# Patient Record
Sex: Female | Born: 1965
Health system: Southern US, Community
[De-identification: ages and names within clinical notes are randomized; demographics above are authoritative.]

## PROBLEM LIST (undated history)

## (undated) DIAGNOSIS — N3281 Overactive bladder: Secondary | ICD-10-CM

## (undated) DIAGNOSIS — S86019A Strain of unspecified Achilles tendon, initial encounter: Secondary | ICD-10-CM

## (undated) DIAGNOSIS — J189 Pneumonia, unspecified organism: Secondary | ICD-10-CM

## (undated) HISTORY — DX: Pneumonia, unspecified organism: J18.9

## (undated) HISTORY — DX: Overactive bladder: N32.81

## (undated) HISTORY — DX: Strain of unspecified achilles tendon, initial encounter: S86.019A

---

## 1998-03-23 ENCOUNTER — Other Ambulatory Visit: Admission: RE | Admit: 1998-03-23 | Discharge: 1998-03-23 | Payer: Self-pay | Admitting: Gynecology

## 1999-06-18 ENCOUNTER — Other Ambulatory Visit: Admission: RE | Admit: 1999-06-18 | Discharge: 1999-06-18 | Payer: Self-pay | Admitting: Gynecology

## 1999-07-31 ENCOUNTER — Emergency Department (HOSPITAL_COMMUNITY): Admission: EM | Admit: 1999-07-31 | Discharge: 1999-07-31 | Payer: Self-pay | Admitting: Internal Medicine

## 2000-07-27 ENCOUNTER — Other Ambulatory Visit: Admission: RE | Admit: 2000-07-27 | Discharge: 2000-07-27 | Payer: Self-pay | Admitting: Gynecology

## 2001-09-19 ENCOUNTER — Other Ambulatory Visit: Admission: RE | Admit: 2001-09-19 | Discharge: 2001-09-19 | Payer: Self-pay | Admitting: Gynecology

## 2002-07-23 ENCOUNTER — Encounter: Admission: RE | Admit: 2002-07-23 | Discharge: 2002-07-23 | Payer: Self-pay | Admitting: Family Medicine

## 2002-08-16 ENCOUNTER — Encounter: Admission: RE | Admit: 2002-08-16 | Discharge: 2002-08-16 | Payer: Self-pay | Admitting: Sports Medicine

## 2002-09-25 ENCOUNTER — Other Ambulatory Visit: Admission: RE | Admit: 2002-09-25 | Discharge: 2002-09-25 | Payer: Self-pay | Admitting: Gynecology

## 2003-10-20 ENCOUNTER — Other Ambulatory Visit: Admission: RE | Admit: 2003-10-20 | Discharge: 2003-10-20 | Payer: Self-pay | Admitting: Gynecology

## 2003-10-22 ENCOUNTER — Encounter: Admission: RE | Admit: 2003-10-22 | Discharge: 2003-10-22 | Payer: Self-pay | Admitting: General Surgery

## 2004-12-21 ENCOUNTER — Other Ambulatory Visit: Admission: RE | Admit: 2004-12-21 | Discharge: 2004-12-21 | Payer: Self-pay | Admitting: Gynecology

## 2004-12-23 ENCOUNTER — Encounter: Admission: RE | Admit: 2004-12-23 | Discharge: 2004-12-23 | Payer: Self-pay | Admitting: Gynecology

## 2006-02-08 ENCOUNTER — Other Ambulatory Visit: Admission: RE | Admit: 2006-02-08 | Discharge: 2006-02-08 | Payer: Self-pay | Admitting: Gynecology

## 2006-02-16 ENCOUNTER — Encounter: Admission: RE | Admit: 2006-02-16 | Discharge: 2006-02-16 | Payer: Self-pay | Admitting: Gynecology

## 2006-11-16 ENCOUNTER — Ambulatory Visit: Payer: Self-pay | Admitting: Internal Medicine

## 2007-04-30 ENCOUNTER — Ambulatory Visit: Payer: Self-pay | Admitting: Internal Medicine

## 2007-05-17 ENCOUNTER — Other Ambulatory Visit: Admission: RE | Admit: 2007-05-17 | Discharge: 2007-05-17 | Payer: Self-pay | Admitting: Obstetrics & Gynecology

## 2007-07-09 ENCOUNTER — Encounter: Admission: RE | Admit: 2007-07-09 | Discharge: 2007-07-09 | Payer: Self-pay | Admitting: Obstetrics & Gynecology

## 2008-07-08 ENCOUNTER — Other Ambulatory Visit: Admission: RE | Admit: 2008-07-08 | Discharge: 2008-07-08 | Payer: Self-pay | Admitting: Obstetrics and Gynecology

## 2008-07-31 ENCOUNTER — Encounter: Admission: RE | Admit: 2008-07-31 | Discharge: 2008-07-31 | Payer: Self-pay | Admitting: Obstetrics & Gynecology

## 2009-08-03 ENCOUNTER — Encounter: Admission: RE | Admit: 2009-08-03 | Discharge: 2009-08-03 | Payer: Self-pay | Admitting: Obstetrics & Gynecology

## 2010-06-03 ENCOUNTER — Ambulatory Visit: Payer: Self-pay | Admitting: Sports Medicine

## 2010-06-03 DIAGNOSIS — S93499A Sprain of other ligament of unspecified ankle, initial encounter: Secondary | ICD-10-CM | POA: Insufficient documentation

## 2010-06-03 DIAGNOSIS — M7662 Achilles tendinitis, left leg: Secondary | ICD-10-CM | POA: Insufficient documentation

## 2010-06-03 DIAGNOSIS — S96819A Strain of other specified muscles and tendons at ankle and foot level, unspecified foot, initial encounter: Secondary | ICD-10-CM

## 2010-06-17 ENCOUNTER — Ambulatory Visit: Payer: Self-pay | Admitting: Sports Medicine

## 2010-07-15 ENCOUNTER — Ambulatory Visit: Payer: Self-pay | Admitting: Sports Medicine

## 2010-08-23 ENCOUNTER — Ambulatory Visit: Payer: Self-pay | Admitting: Sports Medicine

## 2010-09-30 ENCOUNTER — Encounter: Admission: RE | Admit: 2010-09-30 | Discharge: 2010-09-30 | Payer: Self-pay | Admitting: Obstetrics & Gynecology

## 2010-10-12 ENCOUNTER — Ambulatory Visit: Payer: Self-pay | Admitting: Sports Medicine

## 2010-12-14 NOTE — Assessment & Plan Note (Signed)
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 Since her last visit, she has been using the nitroglycerine patches on her left heel with no notable side effects. She did try to do the heel raises once, but felt some pain and soreness in her left calf, so she did not continue. Overall, she feels improved, with no pain in her achilles today. She has some mild tightness in the tendon this morning, that she suspects is due to the cycling and elliptical she has done over the past three days. She is not taking any anit-inflammatories, and has not tried to do any running.  She denies any new pain in her foot or ankle.  Allergies: No Known Drug Allergies  Physical Exam  General:  Well-developed,well-nourished,in no acute distress; alert,appropriate and cooperative throughout examination Msk:  ANKLE: right achilles without edema or erythma, no tenderness to palpation along or beside the tendon. No tenderness to palpation at the medial and lateral malleoli, or the plantar fascia insertion on the calcaneus.  Dorsiflexion and plantarflexion of the right ankle is without pain, standing and walking are without pain as well.  Running gait after insertion of heel lifts was normal, without pain. Additional Exam:  Ultrasound  AT shows much improved hypoechoic areas that represent improvement in the partial tendon tear. Less doppler blood flow to the tendon than previously.  Images saved   Impression & Recommendations:  Problem # 1:  ACHILLES TENDON TEAR (ICD-845.09)  Patient reports improvement in clinical symptoms and ultrasound shows great improvement. The  nitro patches and rest seem to have worked well. She will slowly begin to do the calf exercises, increasing her reps and sets as she feels able. She will use her new sports insoles with heel lifts when she runs and walks. She may start slowly, as per patient instructions, being careful not to illicit pain. She should also continue to ice her heels after exercise. She should continue to use the nitro patches and return in four weeks to ensure continued healing.  Orders: Sports Insoles [phone])  Complete Medication List: 1)  Loestrin Fe 1/20 Tabs (Norethin ace-eth estrad-fe tabs) 2)  Zithromax Z-pak Tabs (Azithromycin tabs) .... As directed 3)  Guaifenesin Ac 100-10 Mg/56ml Liqd (Guaifenesin-codeine) .... One or 2 teaspoons p.o. nightly p.r.n. cough 4)  Nitroglycerin 0.2 Mg/hr Pt24 (Nitroglycerin) .... Apply 1/4 patch to affected area on achilles daily.  Patient Instructions: 1)  Ease into calf exercises 2)  heels are good 3)  use sports insoles w the heel lifts 4)  OK to ease into some running 5)  try 5 mins / walk 2 for total of 21 mins for 1 week 6)  then 5/2 for 28 mins week 2 7)  Then 15/2  x 2 for a total of 34 mins week 3 8)  week 4 7mins/ 2 walk x 2 - total of 44 mins and then reck 9)  keep up NTG 10)  keep up icing at end of Tx 11)  reck 4 wks Prescriptions: NITROGLYCERIN 0.2 MG/HR  PT24 (NITROGLYCERIN) Apply 1/4 patch to affected area on achilles daily.  #30 x 0   Entered and Authorized by:   Enid Baas MD   Signed by:   Enid Baas MD on 06/17/2010   Method used:   Electronically to        [facility] (retail)       [address]       [place], [place]  [address]       Ph: [phone]       Fax: [phone]   RxID:   [id number]

## 2010-12-14 NOTE — Assessment & Plan Note (Signed)
Summary: FOOT ISSUES   Vital Signs:  Patient profile:   45 year old female Weight:      128 pounds Pulse rate:   60 / minute BP sitting:   150 / 84  (left arm)  Vitals Entered By: Terese Door (June 03, 2010 1:58 PM) CC: left heel issue Pain Assessment Patient in pain? yes     Location: heel Intensity: 3   CC:  left heel issue.  History of Present Illness: 45 yo F with pain at left heel since Sunday.  Pt is a regular long distance runner, does marathons.  Ran 8 miles, did fine, then Monday had some swelling in her heel.  Didn't think much of it and ran another 5 miles without pain.  Later that evening she developed worsening swelling to the size of a golf ball and pain, antalgic gait.  She averages 6 miles a day and was going to start training for another marathon in the fall.  Current Medications (verified): 1)  Loestrin Fe 1/20   Tabs (Norethin Ace-Eth Estrad-Fe Tabs) 2)  Zithromax Z-Pak   Tabs (Azithromycin Tabs) .... As Directed 3)  Guaifenesin Ac 100-10 Mg/8ml  Liqd (Guaifenesin-Codeine) .... One or 2 Teaspoons P.o. Nightly P.r.n. Cough 4)  Nitroglycerin 0.2 Mg/hr Pt24 (Nitroglycerin) .... Apply 1/4 Patch To Affected Area On Achilles Daily.  Allergies (verified): No Known Drug Allergies  Review of Systems       See HPI  Physical Exam  General:  Well-developed,well-nourished,in no acute distress; alert,appropriate and cooperative throughout examination Msk:  Ankle: L significant swelling at insertion of achilles into calcaneus. Range of motion is full in all directions. Strength is 5/5 in all directions. Stable lateral and medial ligaments; squeeze test and kleiger test unremarkable; Talar dome nontender; No pain at base of 5th MT; No tenderness over cuboid; No tenderness over N spot or navicular prominence No tenderness on posterior aspects of lateral and medial malleolus No sign of peroneal tendon subluxations; Negative tarsal tunnel tinel's Able to walk 4  steps. Additional Exam:  MSK [place]: Partial tear noted on deep surface of achilles tendon.  Hypoechoic change in this area that extends into retrocalcaneal bursa. seen.  Minimal hypervascularity seen with doppler.  Images saved.   Impression & Recommendations:  Problem # 1:  ACHILLES TENDON TEAR (ICD-845.09) Assessment New  Partial tear.   NTG 0.2mg /hr 1/4 patch to achilles insertion daily. Heel lifts, start 2 inches, down on 1 up on 2, then in 1-2wks when painless can progress to down on 1 up on 1. May run in 2 weeks if painless but would need to see her to re-scan before she does this. Otherwise RTC 4 weeks for re-scan.  Orders: [place] LIMITED ([id number])  Complete Medication List: 1)  Loestrin Fe 1/20 Tabs (Norethin ace-eth estrad-fe tabs) 2)  Zithromax Z-pak Tabs (Azithromycin tabs) .... As directed 3)  Guaifenesin Ac 100-10 Mg/101ml Liqd (Guaifenesin-codeine) .... One or 2 teaspoons p.o. nightly p.r.n. cough 4)  Nitroglycerin 0.2 Mg/hr Pt24 (Nitroglycerin) .... Apply 1/4 patch to affected area on achilles daily.  Patient Instructions: 1)  Great to meet you, 2)  You have a small tear in your achilles tendon. 3)  Nitroglycerin 0.2 1/4 patch on the area of maximal tenderness daily. 4)  Do heel drops from a height of  ~2 inches down on 1, up on both for a week or 2, if getting strong and painless with that then go down on 1 and up on one, slowly. 5)  You can run in 2 weeks if painless. 6)  Come back to see [place] in 4 weeks to re-scan. 7)  -Dr. Benjamin Stain Prescriptions: NITROGLYCERIN 0.2 MG/HR PT24 (NITROGLYCERIN) Apply 1/4 patch to affected area on achilles daily.  #4 weeks QS x 0   Entered by:   Rodney Langton MD   Authorized by:   Enid Baas MD   Signed by:   Rodney Langton MD on 06/03/2010   Method used:   Electronically to        [facility] (retail)       [address]       [place], [place]  [address]       Ph: [phone]        Fax: [phone]   RxID:   [id number]

## 2010-12-14 NOTE — Assessment & Plan Note (Signed)
Summary: F/U,MC   Vital Signs:  Patient profile:   45 year old female BP sitting:   122 / 81  Vitals Entered By: Lillia Pauls CMA (August 23, 2010 9:56 AM)  History of Present Illness: Sun feels about 80% better  She is able to run 30 to 35 MPW avoiding hills cont on NTG w no side effects  she is able to do 2 sets of heel raises on step w one leg but not 3 only 10 reps to set  Allergies: No Known Drug Allergies  Physical Exam  General:  Well-developed,well-nourished,in no acute distress; alert,appropriate and cooperative throughout examination Msk:  palpation of AT bilat reveals no TTP no swelling no nodules heel nontender no limp on walk    Impression & Recommendations:  Problem # 1:  ACHILLES TENDON TEAR (ICD-845.09)  This  is much improved on sxs and on scanning  still weak on Heel raises on step will progress these until she can do 3 sets of 15 on 1 leg  NTG keep as is for 1 wk every other day for 1 wk q 3d for 1 wk  MSK [place] is now normalized to width of 0.41 cms norm doppler flow no nodules slt hypoechoic fluid at insertion of AT only seen long and trans view images saved  follow above and then try to wean off and see me in 5 to 6 weeks with running shoes and for gait eval  Orders: [place] LIMITED ([id number])  Complete Medication List: 1)  Loestrin Fe 1/20 Tabs (Norethin ace-eth estrad-fe tabs) 2)  Zithromax Z-pak Tabs (Azithromycin tabs) .... As directed 3)  Guaifenesin Ac 100-10 Mg/79ml Liqd (Guaifenesin-codeine) .... One or 2 teaspoons p.o. nightly p.r.n. cough 4)  Nitroglycerin 0.2 Mg/hr Pt24 (Nitroglycerin) .... Apply 1/4 patch to affected area on achilles daily.

## 2010-12-14 NOTE — Assessment & Plan Note (Signed)
Summary: f/u,mc   Vital Signs:  Patient profile:   45 year old female BP sitting:   124 / 70  Vitals Entered By: Lillia Pauls CMA (October 12, 2010 11:38 AM)  History of Present Illness: Pt returns to clinic for f/u of left achilles tendon which she reports is improved, no longer having pain.  Running normally- longest run 13 miles- no problems. Taking NTG every 3 days. no swelling after runs comft in green sports insoles w heel lift   Allergies: No Known Drug Allergies  Physical Exam  General:  Well-developed,well-nourished,in no acute distress; alert,appropriate and cooperative throughout examination Msk:  No fluid in lt achilles tendon. no nodules noted no TTP good strength  running gait looks neutral no limp midfoot strike with good form     Impression & Recommendations:  Problem # 1:  ACHILLES TENDON TEAR (ICD-845.09) This clinically appears healed and was norm by [place] on last visit  will let her stop NTG  cont exercises  modify training  as needed rtc  Complete Medication List: 1)  Loestrin Fe 1/20 Tabs (Norethin ace-eth estrad-fe tabs) 2)  Zithromax Z-pak Tabs (Azithromycin tabs) .... As directed 3)  Guaifenesin Ac 100-10 Mg/29ml Liqd (Guaifenesin-codeine) .... One or 2 teaspoons p.o. nightly p.r.n. cough 4)  Nitroglycerin 0.2 Mg/hr Pt24 (Nitroglycerin) .... Apply 1/4 patch to affected area on achilles daily.  Patient Instructions: 1)  Achilles tendon has healed, ok to stop NTG.  2)  Continue doing achilles exercises 3 times per week. 3)  Ok to incorporate gradually speed work and hills.  4)  For training during a week- can do intervals or hills 5)  the next week do a tempo run- run 20 minutes at 5-10K pace 6)  Every other week do long run    Orders Added: 1)  Est. Patient Level III [78295]

## 2010-12-14 NOTE — Assessment & Plan Note (Signed)
Summary: F/U U/S,MC   Vital Signs:  Patient profile:   45 year old female BP sitting:   137 / 89  Vitals Entered By: Enid Baas MD (July 15, 2010 1:56 PM)  History of Present Illness: Left AT is now pain free now 6 weeks of NTG no swelling no side effects w meds  now is up to 20 mins run / 2 walk/ x 2   daily x 2 then 1 rest day  Allergies: No Known Drug Allergies  Physical Exam  General:  Well-developed,well-nourished,in no acute distress; alert,appropriate and cooperative throughout examination Msk:  left non tender to squeeze of AT slt swelling over calcaneus  no swelling over AT able to walk and do  heel raise w no pain Additional Exam:  MSK [place] area of tear seems intact now some edema persists AT is 0.47 LT vs 0.42 RT swelling in left retrocalcaneal bursa persists no abnl doppler  images saved   Impression & Recommendations:  Problem # 1:  ACHILLES TENDON TEAR (ICD-845.09)  much improved  move exercises to 1 leg  run up to 30 mins cont and inc 5 min per wk  cont ntg  reck 6 wks  Orders: [place] LIMITED ([id number])  Complete Medication List: 1)  Loestrin Fe 1/20 Tabs (Norethin ace-eth estrad-fe tabs) 2)  Zithromax Z-pak Tabs (Azithromycin tabs) .... As directed 3)  Guaifenesin Ac 100-10 Mg/74ml Liqd (Guaifenesin-codeine) .... One or 2 teaspoons p.o. nightly p.r.n. cough 4)  Nitroglycerin 0.2 Mg/hr Pt24 (Nitroglycerin) .... Apply 1/4 patch to affected area on achilles daily.

## 2011-01-25 ENCOUNTER — Ambulatory Visit (INDEPENDENT_AMBULATORY_CARE_PROVIDER_SITE_OTHER): Payer: 59 | Admitting: Sports Medicine

## 2011-01-25 ENCOUNTER — Encounter: Payer: Self-pay | Admitting: Sports Medicine

## 2011-01-25 DIAGNOSIS — M7660 Achilles tendinitis, unspecified leg: Secondary | ICD-10-CM | POA: Insufficient documentation

## 2011-02-01 NOTE — Assessment & Plan Note (Signed)
Summary: ACHILLIES INJURY,MC TRAINING FOR MARATHON   Vital Signs:  Patient profile:   45 year old female BP sitting:   142 / 80  Vitals Entered By: Lillia Pauls CMA (January 25, 2011 9:02 AM)  History of Present Illness: Past sat did 21 mi run next day heel is painful seen last summer w AT and swollen bursa still keeping up some calf exercises  MPW 40 to 50 no real probs no change in shoes  Allergies: No Known Drug Allergies  Physical Exam  General:  Well-developed,well-nourished,in no acute distress; alert,appropriate and cooperative throughout examination Msk:  no real swelling at left AT pinch test is neg slt tenderness just behind AT calcaneus squeeze nontender arch normal to slt high  able to do heel raise  MSK [place] there is discreet swelling in the retrocalcaneal bursa the AT is normal at 0.37 no real increase in doppler flow no tears   Impression & Recommendations:  Problem # 1:  ACHILLES BURSITIS OR TENDINITIS (ICD-726.71)  while this is same tendon as she had w a partial tear the only changes today are in the retrocalcaneal bursa  will give short course of prednisone use heel lift start AT exercise protocol  reck 2 wks keep training easy until reck  Orders: [place] LIMITED ([id number])  Complete Medication List: 1)  Loestrin Fe 1/20 Tabs (Norethin ace-eth estrad-fe tabs) 2)  Zithromax Z-pak Tabs (Azithromycin tabs) .... As directed 3)  Guaifenesin Ac 100-10 Mg/19ml Liqd (Guaifenesin-codeine) .... One or 2 teaspoons p.o. nightly p.r.n. cough 4)  Nitroglycerin 0.2 Mg/hr Pt24 (Nitroglycerin) .... Apply 1/4 patch to affected area on achilles daily. 5)  Prednisone 20 Mg Tabs (Prednisone) .Marland Kitchen.. 1 by mouth bid  Patient Instructions: 1)  Take 2 days of prednisone 2)  if at that point you can do heel raises on 1 leg without any real pain then you are ready to run 3)  if not wait until you can do 3 x 15 on 1 leg and xtrain on bike 4)  use prednisone minimum of 5  days 5)  ice 2 or 3 x daily 6)  keep heel lift  7)  rescan in 2 weeks Prescriptions: PREDNISONE 20 MG TABS (PREDNISONE) 1 by mouth bid  #14 x 0   Entered and Authorized by:   Enid Baas MD   Signed by:   Enid Baas MD on 01/25/2011   Method used:   Electronically to        [facility] (retail)       [address]       [place], [place]  [address]       Ph: [phone]       Fax: [phone]   RxID:   [id number]    Orders Added: 1)  Est. Patient Level III [44010] 2)  [place] LIMITED [[id number]]

## 2011-02-10 ENCOUNTER — Ambulatory Visit (INDEPENDENT_AMBULATORY_CARE_PROVIDER_SITE_OTHER): Payer: 59 | Admitting: Sports Medicine

## 2011-02-10 DIAGNOSIS — M7660 Achilles tendinitis, unspecified leg: Secondary | ICD-10-CM

## 2011-02-10 MED ORDER — KETOPROFEN POWD: Status: DC

## 2011-02-10 NOTE — Progress Notes (Signed)
  Subjective:    Patient ID: Whitney Frederick, female    DOB: [birth year], 45 y.o.   MRN: [id number]  HPI  Last seen w retrocalcaneal bursitis Started on prednisone and feeling better in 2 days Started AT exercises and these are now pain free  took 5 full days off running and eased back into running  Has steadily built up 10 days after TX up to 12 mile run w no pain  No pain  Not seen any real swelling  Review of Systems     Objective:   Physical Exam Muscle testing of hips - full strength in abd, flex, add, rotation Calf non tender AT non tender  Percussion of calcaneus is not tender  No swelling No redness    Musculoskeletal ultrasound  The left retrocalcaneal bursa is still about 50% more swollen than the right. The Achilles tendon on both sides is normal. There is no abnormal Doppler activity. In comparison to the last scan there is also about a 50% reduction in the amount of swelling on the left versus 2 weeks ago.   Assessment & Plan:

## 2011-02-10 NOTE — Assessment & Plan Note (Signed)
I asked her to continue the rehabilitation exercises on a daily basis. Since [place] marathon is only 2 weeks  away I would not try to do a long run.  Keep  runs of moderate intensity.  Ketoprofen gel 20% to use up to 4 times a day.  She will return to [place] after the marathon if symptoms persist.

## 2011-06-21 ENCOUNTER — Encounter: Payer: Self-pay | Admitting: Sports Medicine

## 2012-07-18 ENCOUNTER — Other Ambulatory Visit: Payer: Self-pay | Admitting: Obstetrics & Gynecology

## 2012-07-18 DIAGNOSIS — Z1231 Encounter for screening mammogram for malignant neoplasm of breast: Secondary | ICD-10-CM

## 2012-08-02 ENCOUNTER — Ambulatory Visit
Admission: RE | Admit: 2012-08-02 | Discharge: 2012-08-02 | Disposition: A | Discharge status: Home / Self Care | Payer: 59 | Source: Ambulatory Visit | Attending: Obstetrics & Gynecology | Admitting: Obstetrics & Gynecology

## 2012-08-02 DIAGNOSIS — Z1231 Encounter for screening mammogram for malignant neoplasm of breast: Secondary | ICD-10-CM

## 2012-08-29 ENCOUNTER — Ambulatory Visit (INDEPENDENT_AMBULATORY_CARE_PROVIDER_SITE_OTHER): Payer: 59 | Admitting: Sports Medicine

## 2012-08-29 VITALS — BP 138/84 | Ht 66.0 in | Wt 127.0 lb

## 2012-08-29 DIAGNOSIS — M76829 Posterior tibial tendinitis, unspecified leg: Secondary | ICD-10-CM

## 2012-08-29 DIAGNOSIS — M216X9 Other acquired deformities of unspecified foot: Secondary | ICD-10-CM

## 2012-08-29 DIAGNOSIS — M76822 Posterior tibial tendinitis, left leg: Secondary | ICD-10-CM | POA: Insufficient documentation

## 2012-08-29 NOTE — Assessment & Plan Note (Signed)
Patient was given a metatarsal pad in the sports insoles and had a significant improvement. Patient told she can come back within 2 weeks. If she wants any other changes done. Patient will followup in 3-4 weeks to make sure that her ankle pain has improved.

## 2012-08-29 NOTE — Assessment & Plan Note (Addendum)
Patient's left ankle pain is more consistent with posterior tibs strain been actually with plantar fasciitis. Patient did have corrections to sports insoles done. Patient was given exercises to concentrate on stretching the posterior tibialis tendon. Discussed red flags and when to seek medical attention. In addition this patient was also given a body helix to wear on the left ankle to get some more support. Patient did have improvements with her gait having a much more neutral position and subjectively felt better with the ankle. Patient will try this and followup with [place] in 3-4 weeks before her marathon for reevaluation.

## 2012-08-29 NOTE — Progress Notes (Signed)
History of present illness: Patient is a 46 year old female who is an avid runner here in [place]. Patient is coming in with left heel and ankle pain. Patient is concerned that this could be plantar fasciitis. Patient states that it seems to start on the back of her ankle worse with pushing off during running and radiates to the foot area. Patient denies any stiffness when waking up in the morning. Patient has tried changing shoes which has been somewhat of a benefit. Patient though feels that it is still a nagging problem and she is training for a half marathon. Patient denies any numbness, any recent trauma, any fevers or chills or any discoloration of the foot. Patient was seen a chiropractor here for her low back pain and was found to have her left foot that is supinated and decided to come here for evaluation because for 20 years she has been told she pronates.  Past medical history, social, surgical and family history all reviewed.  Denies fever, chills, nausea vomiting abdominal pain, dysuria, chest pain, shortness of breath dyspnea on exertion or numbness in extremities  Physical exam Blood pressure 138/84, height 5\' 6"  (1.676 m), weight 127 lb (57.607 kg). General: No apparent distress alert and oriented x3 patient is a very fit female appears her age Foot exam: Patient does have some mild over supination of the left hindfoot and midfoot. Patient also has a space between the first and second toes bilaterally and breakdown of the transverse arch. Patient still has a very good - average longitudinal arch. On palpation patient does have tenderness over the posterior tibialis as well as the peroneal tendons. Patient has no pain over the mortise of the ankle or over the lateral and medial malleolus. Patient is able to bear weight without any difficulties. Patient has no signs of hallux rigidus neurovascularly intact distally  Gait analysis: Patient does have quite a bit of supination of the left  foot jogging. In addition to this patient does have significant amount of movement of the left ankle are throughout the strike and push off phase and I think is contributing to her pain. Patient was fitted in a body helix ankle brace as well as the orthotics adjustments stated below. Patient had significant improvement in a much more running neutral position.

## 2012-10-09 ENCOUNTER — Ambulatory Visit (INDEPENDENT_AMBULATORY_CARE_PROVIDER_SITE_OTHER): Payer: 59 | Admitting: Sports Medicine

## 2012-10-09 ENCOUNTER — Ambulatory Visit: Payer: 59 | Admitting: Sports Medicine

## 2012-10-09 VITALS — BP 120/70 | Ht 66.0 in | Wt 127.0 lb

## 2012-10-09 DIAGNOSIS — M238X9 Other internal derangements of unspecified knee: Secondary | ICD-10-CM

## 2012-10-09 DIAGNOSIS — M216X9 Other acquired deformities of unspecified foot: Secondary | ICD-10-CM

## 2012-10-09 DIAGNOSIS — M545 Low back pain, unspecified: Secondary | ICD-10-CM | POA: Insufficient documentation

## 2012-10-09 DIAGNOSIS — M76829 Posterior tibial tendinitis, unspecified leg: Secondary | ICD-10-CM

## 2012-10-09 DIAGNOSIS — R29898 Other symptoms and signs involving the musculoskeletal system: Secondary | ICD-10-CM

## 2012-10-09 DIAGNOSIS — M76822 Posterior tibial tendinitis, left leg: Secondary | ICD-10-CM

## 2012-10-09 NOTE — Assessment & Plan Note (Signed)
With no abnormal callusing we will plan to remove the metatarsal pads  We can replace these if her pain returns

## 2012-10-09 NOTE — Progress Notes (Signed)
Subjective:     Patient ID: Whitney Frederick, female   DOB: 04-29-1966, 46 y.o.   MRN: [id number]  HPI Whitney Frederick is a 46 year old female runner who presents for followup to discuss the following:  1. Left foot posterior tibialis tendinitis: She reports that she has been pain-free since 2 days after her last visit. She is compliant with her shoe inserts with heel pads. She is able to run a half marathon without heel or metatarsal pain. She continues to do heel raise exercises.  2. Right knee grinding: She reports painless right knee grinding when walking up stairs. This grinding started after a course of manipulation of her hip. She denies swelling, locking, joint laxity.  3.  low back pain-this has responded to some chiropractic treatment but I think she needs a consistent home exercise program to prevent this from occurring  Review of Systems  as per history of present illness    Objective:   Physical Exam BP 120/70  Ht 5\' 6"  (1.676 m)  Wt 127 lb (57.607 kg)  BMI 20.50 kg/m2 General appearance: alert, cooperative and no distress Ankle: The patient is normal alignment between her hips ankle and knees. Is full range of motion of her right ankle. Is no tenderness along the Achilles tendon or the lateral heel with passive and active ROM.   Knee:  Popping and grinding noted with knee flexion on the right. This is also noted on the left but is less prominent. There is no swelling. Range of motion is full.  Running gait She has resolved her excess supination on the left She has a neutral running gait with a mid foot strike     Assessment and Plan:

## 2012-10-09 NOTE — Patient Instructions (Addendum)
Texie,  Thank your for coming in today. We are pleased with your improvement.   1. Continue heel lift exercises in 3 positions 3 times weekly.  2. Continue shoe inserts with heel lift. We removed metatarsal pads.   For back: back stretches and lower abdominal exercises 3-4 x per week (see handout).   For knee grinding: match quad strength to hamstring strength with quad exercises. Recumbent bike.   F/u as needed   Drs. Annis Lagoy and Fields.

## 2012-10-09 NOTE — Assessment & Plan Note (Signed)
Knee crepitus without pain in a long distance runner. Patient advised to strengthen quads with recumbent bike exercise.

## 2012-10-09 NOTE — Assessment & Plan Note (Signed)
Try to be consistent with doing at least 3 flexion and 1 extension exercise for low back along with some abdominal exercises on a regular basis

## 2012-10-09 NOTE — Assessment & Plan Note (Addendum)
The patient's pain is resolved completely.   continue the patient in a shoe insert with heel lift bilaterally. The metatarsal pad was removed.

## 2013-08-21 ENCOUNTER — Other Ambulatory Visit: Payer: Self-pay | Admitting: Gynecology

## 2013-08-21 NOTE — Telephone Encounter (Signed)
Annual exam 09/18/13

## 2013-09-17 ENCOUNTER — Encounter: Payer: Self-pay | Admitting: Gynecology

## 2013-09-18 ENCOUNTER — Encounter: Payer: Self-pay | Admitting: Gynecology

## 2013-09-18 ENCOUNTER — Other Ambulatory Visit: Payer: Self-pay | Admitting: Gynecology

## 2013-09-18 ENCOUNTER — Ambulatory Visit (INDEPENDENT_AMBULATORY_CARE_PROVIDER_SITE_OTHER): Payer: BC Managed Care – PPO | Admitting: Gynecology

## 2013-09-18 ENCOUNTER — Telehealth: Payer: Self-pay | Admitting: Emergency Medicine

## 2013-09-18 VITALS — BP 106/70 | HR 68 | Resp 18 | Ht 66.0 in | Wt 120.0 lb

## 2013-09-18 DIAGNOSIS — Z01419 Encounter for gynecological examination (general) (routine) without abnormal findings: Secondary | ICD-10-CM

## 2013-09-18 DIAGNOSIS — N6320 Unspecified lump in the left breast, unspecified quadrant: Secondary | ICD-10-CM

## 2013-09-18 DIAGNOSIS — Z309 Encounter for contraceptive management, unspecified: Secondary | ICD-10-CM

## 2013-09-18 DIAGNOSIS — N63 Unspecified lump in unspecified breast: Secondary | ICD-10-CM

## 2013-09-18 DIAGNOSIS — Z0000 Encounter for general adult medical examination without abnormal findings: Secondary | ICD-10-CM

## 2013-09-18 LAB — POCT URINALYSIS DIPSTICK
Leukocytes, UA: NEGATIVE
Urobilinogen, UA: NEGATIVE
pH, UA: 5

## 2013-09-18 LAB — HEMOGLOBIN, FINGERSTICK: Hemoglobin, fingerstick: 12.6 g/dL (ref 12.0–16.0)

## 2013-09-18 MED ORDER — NORETHIN-ETH ESTRAD-FE BIPHAS 1 MG-10 MCG / 10 MCG PO TABS: 1.0000 | ORAL_TABLET | Freq: Every day | ORAL | Status: DC

## 2013-09-18 NOTE — Telephone Encounter (Signed)
Patient has been contacted by [facility] but has not scheduled. Will follow up. Will placed patient in [facility] hold.

## 2013-09-18 NOTE — Progress Notes (Signed)
47 y.o. Married Caucasian female   G0P0000 here for annual exam. Pt is currently sexually active.  Using ocp.  No LMP recorded.          Sexually active: yes  The current method of family planning is oral progesterone-only contraceptive.    Exercising: yes  running 6x/wk Last pap: 09/13/12 NEG HR HPV Alcohol: 5-10 drinks/wk (depends) Tobacco: no BSE: yes  Mammogram:  07/2013-[facility] 1  Hgb: 12.6 ; Urine: Small Ketones    Health Maintenance  Topic Date Due  . Influenza Vaccine  06/14/2013  . Pap Smear  09/14/2015  . Tetanus/tdap  07/17/2019    Family History  Problem Relation Age of Onset  . Breast cancer Sister 70  . Hypertension Sister   . Infertility Sister   . Hypertension Mother   . Hypertension Father   . Heart attack Father 51  . Hypertension Brother   . Infertility Sister     Patient Active Problem List   Diagnosis Date Noted  . Knee crepitus 10/09/2012  . Low back pain 10/09/2012  . Posterior tibial tendinitis of left leg 08/29/2012  . Loss of transverse plantar arch 08/29/2012  . ACHILLES BURSITIS OR TENDINITIS 01/25/2011  . ACHILLES TENDON TEAR 06/03/2010    Past Medical History  Diagnosis Date  . OAB (overactive bladder)     No past surgical history on file.  Allergies: Review of patient's allergies indicates no known allergies.  Current Outpatient Prescriptions  Medication Sig Dispense Refill  . JUNEL FE 1/20 1-20 MG-MCG tablet Take one tablet by mouth one time daily  30 tablet  0  . Ketoprofen POWD Ketoprofen 20% gel apply to affected area 4 times per day as needed.  60 g  prn   No current facility-administered medications for this visit.    ROS: Pertinent items are noted in HPI.  Exam:    There were no vitals taken for this visit. Weight change: @WEIGHTCHANGE @ Last 3 height recordings:  Ht Readings from Last 3 Encounters:  10/09/12 5\' 6"  (1.676 m)  08/29/12 5\' 6"  (1.676 m)  06/17/10 5\' 6"  (1.676 m)   General appearance: alert,  cooperative and appears stated age Head: Normocephalic, without obvious abnormality, atraumatic Neck: no adenopathy, no carotid bruit, no JVD, supple, symmetrical, trachea midline and thyroid not enlarged, symmetric, no tenderness/mass/nodules Lungs: clear to auscultation bilaterally Breasts: normal appearance, no masses or tenderness on right, left lateral 1:30 mobile, non-tender mass, 1.5cm, no skin changes Heart: regular rate and rhythm, S1, S2 normal, no murmur, click, rub or gallop Abdomen: soft, non-tender; bowel sounds normal; no masses,  no organomegaly Extremities: extremities normal, atraumatic, no cyanosis or edema Skin: Skin color, texture, turgor normal. No rashes or lesions Lymph nodes: Cervical, supraclavicular, and axillary nodes normal. no inguinal nodes palpated Neurologic: Grossly normal   Pelvic: External genitalia:  no lesions              Urethra: normal appearing urethra with no masses, tenderness or lesions              Bartholins and Skenes: normal                 Vagina: normal appearing vagina with normal color and discharge, no lesions              Cervix: normal appearance              Pap taken: no        Bimanual Exam:  Uterus:  uterus is normal size, shape, consistency and nontender                                      Adnexa:    normal adnexa in size, nontender and no masses                                      Rectovaginal: Confirms                                      Anus:  normal sphincter tone, no lesions  A: well woman Contraceptive management Left breast mass Family history of breast cancer in sister     P: mammogram-diagnostic on left, if normal, pt may want to consider 3d in future due to dense breast pap smear not done Change to LoLoestrin #3, refill3 counseled on breast self exam, mammography screening, adequate intake of calcium and vitamin D, diet and exercise return annually or prn   An After Visit Summary was printed and given to  the patient.

## 2013-09-18 NOTE — Telephone Encounter (Signed)
Message copied by Joeseph Amor on Wed Sep 18, 2013  4:39 PM ------      Message from: Douglass Rivers      Created: Wed Sep 18, 2013 12:04 PM       Felt breast mass, had mammos at breast center, sent in order for diagnostic, FYI ------

## 2013-09-18 NOTE — Patient Instructions (Signed)

## 2013-09-19 NOTE — Telephone Encounter (Signed)
Patient has appointment 11/24 at [facility] imaging.  Still in [facility]G hold.

## 2013-10-03 ENCOUNTER — Ambulatory Visit
Admission: RE | Admit: 2013-10-03 | Discharge: 2013-10-03 | Disposition: A | Discharge status: Home / Self Care | Payer: BC Managed Care – PPO | Source: Ambulatory Visit | Attending: Gynecology | Admitting: Gynecology

## 2013-10-03 DIAGNOSIS — N6320 Unspecified lump in the left breast, unspecified quadrant: Secondary | ICD-10-CM

## 2013-10-07 ENCOUNTER — Other Ambulatory Visit: Payer: 59

## 2013-12-10 ENCOUNTER — Encounter: Payer: Self-pay | Admitting: Sports Medicine

## 2013-12-10 ENCOUNTER — Ambulatory Visit (INDEPENDENT_AMBULATORY_CARE_PROVIDER_SITE_OTHER): Payer: BC Managed Care – PPO | Admitting: Sports Medicine

## 2013-12-10 ENCOUNTER — Ambulatory Visit: Payer: BC Managed Care – PPO | Admitting: Family Medicine

## 2013-12-10 VITALS — BP 126/80 | Ht 66.0 in | Wt 125.0 lb

## 2013-12-10 DIAGNOSIS — S86819A Strain of other muscle(s) and tendon(s) at lower leg level, unspecified leg, initial encounter: Secondary | ICD-10-CM

## 2013-12-10 DIAGNOSIS — S86119A Strain of other muscle(s) and tendon(s) of posterior muscle group at lower leg level, unspecified leg, initial encounter: Secondary | ICD-10-CM

## 2013-12-10 DIAGNOSIS — S838X9A Sprain of other specified parts of unspecified knee, initial encounter: Secondary | ICD-10-CM

## 2013-12-10 MED ORDER — NITROGLYCERIN 0.2 MG/HR TD PT24
MEDICATED_PATCH | TRANSDERMAL | Status: DC
Start: 2013-12-10 — End: 2014-01-23

## 2013-12-10 NOTE — Assessment & Plan Note (Signed)
Calf compression sleeve  HEP  NTG protocol  Reck 6 weeks  Limited running while doing rehab

## 2013-12-10 NOTE — Patient Instructions (Signed)
Please do suggested exercises daily  Use calf sleeve for running  Start 1/4 nitroglycerin patch daily, this was sent to your pharmacy  Nitroglycerin Protocol   Apply 1/4 nitroglycerin patch to affected area daily.  Change position of patch within the affected area every 24 hours.  You may experience a headache during the first 1-2 weeks of using the patch, these should subside.  If you experience headaches after beginning nitroglycerin patch treatment, you may take your preferred over the counter pain reliever.  Another side effect of the nitroglycerin patch is skin irritation or rash related to patch adhesive.  Please notify our office if you develop more severe headaches or rash, and stop the patch.  Tendon healing with nitroglycerin patch may require 12 to 24 weeks depending on the extent of injury.  Men should not use if taking Viagra, Cialis, or Levitra.   Do not use if you have migraines or rosacea.   Please follow up in 6 weeks  Thank you for seeing us today!

## 2013-12-10 NOTE — Progress Notes (Signed)
   Subjective:    Patient ID: Koren ShiverMaureen Lentz, female    DOB: Jul 06, 1966, 48 y.o.   MRN: [id number]  HPI Ms Susann GivensFranklin is a 48 year-old lady who presents stating that she has had left calf pain for over a month.  She is an avid runner and went out for a run in mid-December, stated that at about mile #3 she felt sharp pain in the medial part of her calf, 8/10 in intensity, then limped home.  States she rested her calf thinking it would heal on it's own in the subsequent weeks.  States that overall pain is better, but when she starts to walk or run, she notices that her left calf seems to tighten up.  She has a history of achilles tendon tear of left side also.  Before injury had been at mod high mileage - 45 per wk Day of injury running faster pace   Review of Systems As above    Objective:   Physical Exam General: NAD; well-developed; well-nourished BP 126/80  Ht 5\' 6"  (1.676 m)  Wt 125 lb (56.7 kg)  BMI 20.19 kg/m2  HEENT: [place]/AT; PER Respiratory: work of breathing unlabored MSK: Left leg: marked tenderness of medial head of gastrocnemius.  Neovascularly intact distally.  No swelling appreciated.  Calf circumference 12 cm below posterior knee crease of right leg is over 1 cm greater than left calf circumference. Neuro: AAOx3; CN grossly intact       Assessment & Plan:  Left calf pain: -Likely 2/2 tear of medial head of gastrocnemius.  Ultrasound showing hypoechoic area in same region. -Patient given calf exercises (she remembers them from achilles home exercises). -Nitro patch to area daily.   The above was written by Benard RinkKristin Ashley Hine, MD visiting PGY III

## 2013-12-25 ENCOUNTER — Telehealth: Payer: Self-pay | Admitting: Obstetrics & Gynecology

## 2013-12-25 NOTE — Telephone Encounter (Signed)
Spoke with patient. Advised Dr. Hyacinth MeekerMiller is out of the office today. She requested that I send a message and ask Dr. Hyacinth MeekerMiller to call her when she could. I advised that I would send a message. She states this isn't an emergency. Best number to reach her at [phone] Mobile.

## 2013-12-25 NOTE — Telephone Encounter (Signed)
Patient is calling saying she only wanted to talk to dr Hyacinth Meekermiller about their meeting yesterday.

## 2013-12-27 NOTE — Telephone Encounter (Signed)
Returned phone call.  Voice mail left for pt.  Encounter closed.

## 2014-01-22 ENCOUNTER — Ambulatory Visit (INDEPENDENT_AMBULATORY_CARE_PROVIDER_SITE_OTHER): Payer: BC Managed Care – PPO | Admitting: Sports Medicine

## 2014-01-22 ENCOUNTER — Encounter: Payer: Self-pay | Admitting: Sports Medicine

## 2014-01-22 VITALS — BP 152/98 | Ht 66.0 in | Wt 127.0 lb

## 2014-01-22 DIAGNOSIS — S86119A Strain of other muscle(s) and tendon(s) of posterior muscle group at lower leg level, unspecified leg, initial encounter: Secondary | ICD-10-CM

## 2014-01-22 DIAGNOSIS — S838X9A Sprain of other specified parts of unspecified knee, initial encounter: Secondary | ICD-10-CM

## 2014-01-22 DIAGNOSIS — S86819A Strain of other muscle(s) and tendon(s) at lower leg level, unspecified leg, initial encounter: Secondary | ICD-10-CM

## 2014-01-22 DIAGNOSIS — M25571 Pain in right ankle and joints of right foot: Secondary | ICD-10-CM | POA: Insufficient documentation

## 2014-01-22 DIAGNOSIS — M25579 Pain in unspecified ankle and joints of unspecified foot: Secondary | ICD-10-CM

## 2014-01-22 NOTE — Progress Notes (Signed)
   Subjective:    Patient ID: Whitney Frederick, female    DOB: [birth year], 48 y.o.   MRN: [id number]  HPI  Pt presents to clinic for f/u of left calf tear which has greatly improved.  She started NTG patches at last visit. Doing home exercises, and up to running 5 miles without pain. She was unable to do calf raises before starting the NTG protocol. Calf has been pain free since 8 days after starting NTG.   Also has rt ankle pain since sprain 08/2013.  Pain is over sinus tarsi, gets this with wearing certain shoes like high heels. She hears a clicking sound in the ankle. No swelling  Review of Systems     Objective:   Physical Exam NAD/ thin  BP 152/98  Ht 5\' 6"  (1.676 m)  Wt 127 lb (57.607 kg)  BMI 20.51 kg/m2  No pain with palpation or squeezing of  Left calf No defect noted in calf  Posterior tib function bilat Does heel raise without difficulty  Rt ankle Click with inversion below lat mallelous  Ligaments stable elicits pain with inversion and plantar flexion but this is mild  Musculoskeletal ultrasound The left medial gastrocnemius shows an organizing hematoma that is approximately 4-5 cm long that terminates at the distal muscle-tendon junction This is seen well on longitudinal and transverse scans Doppler flow outlines the periphery of the hematoma Remainder of muscle and the soleus muscle look normal  Right ankle There is some increased fluid surrounding the right peroneus brevis muscle just below the lateral malleolus This extends downward to the peroneal tubercle On dynamic motion there is a small gap in the peroneus brevis and the peroneal longus appears to move in and out of the small tear        Assessment & Plan:

## 2014-01-22 NOTE — Assessment & Plan Note (Signed)
Clinically she has made dramatic progress and has resolved all of her pain as well as returning to running up to 5 miles  On ultrasound there is an organized hematoma about 4-5 cm long at the distal portion of the medial gastrocnemius  she needs to continue using compression, Nitroglycerin, heel lift and doing home exercises  Recheck 6 weeks

## 2014-01-23 MED ORDER — NITROGLYCERIN 0.2 MG/HR TD PT24
MEDICATED_PATCH | TRANSDERMAL | Status: DC
Start: 2014-01-23 — End: 2014-10-23

## 2014-01-23 NOTE — Addendum Note (Signed)
Addended by: Jacki ConesMARTIN, AMY C on: 01/23/2014 09:06 AM   Modules accepted: Orders

## 2014-03-05 ENCOUNTER — Encounter: Payer: Self-pay | Admitting: Sports Medicine

## 2014-03-05 ENCOUNTER — Ambulatory Visit (INDEPENDENT_AMBULATORY_CARE_PROVIDER_SITE_OTHER): Payer: BC Managed Care – PPO | Admitting: Sports Medicine

## 2014-03-05 VITALS — BP 135/85 | HR 67 | Ht 66.0 in | Wt 127.0 lb

## 2014-03-05 DIAGNOSIS — S838X9A Sprain of other specified parts of unspecified knee, initial encounter: Secondary | ICD-10-CM

## 2014-03-05 DIAGNOSIS — S86119A Strain of other muscle(s) and tendon(s) of posterior muscle group at lower leg level, unspecified leg, initial encounter: Secondary | ICD-10-CM

## 2014-03-05 DIAGNOSIS — S86819A Strain of other muscle(s) and tendon(s) at lower leg level, unspecified leg, initial encounter: Secondary | ICD-10-CM

## 2014-03-05 NOTE — Progress Notes (Signed)
Patient ID: Whitney ShiverMaureen Frederick, female   DOB: 08-12-1966, 48 y.o.   MRN: [id number]  Patient continues to make great progress from her left gastrocnemius tear She was able to start running 8 days after starting nitroglycerin and that was after 7 weeks of trying rest and home exercises before she came for evaluation On last visit we gradually increased her training since she was doing better She is running 5-7 miles a day for 5 days of each week without any increase in pain  Continues to use the nitroglycerin and a compression sleeve  Right ankle pain is now stable. She has not seen any swelling. She still gets a sharp pain at times with movement of the ankle.  Physical exam No acute distress  Examination of the left calf reveals no tenderness to palpation. There is no swelling or discoloration. She can do calf raises without pain and shows good muscle definition.  RT ankle Ankle: No visible erythema or swelling. Range of motion is full in all directions. Strength is 5/5 in all directions. Stable lateral and medial ligaments; squeeze test and kleiger test unremarkable; Talar dome nontender; No pain at base of 5th MT; No tenderness over cuboid; No tenderness over N spot or navicular prominence No tenderness on posterior aspects of lateral and medial malleolus No sign of peroneal tendon subluxations; Negative tarsal tunnel tinel's Able to walk 4 steps.  Ultrasound The large hematoma in the left medial gastroc area has decreased in both length and thickness There seems to be some decrease in the hypoechoic nature of the defect The remainder of the gastrocnemius looks normal

## 2014-03-05 NOTE — Assessment & Plan Note (Signed)
Continued resolution of what I think was a large hematoma in her medial gastroc Clinically this is healing very well  Continue home exercise program Continued calf compression Continue nitroglycerin for 2-3 more months  Recheck 3-4 months

## 2014-04-24 ENCOUNTER — Other Ambulatory Visit: Payer: Self-pay | Admitting: *Deleted

## 2014-04-24 ENCOUNTER — Encounter: Payer: Self-pay | Admitting: Sports Medicine

## 2014-04-24 MED ORDER — NITROGLYCERIN 0.2 MG/HR TD PT24: MEDICATED_PATCH | TRANSDERMAL | Status: DC

## 2014-08-29 ENCOUNTER — Other Ambulatory Visit: Payer: Self-pay

## 2014-08-29 DIAGNOSIS — Z1231 Encounter for screening mammogram for malignant neoplasm of breast: Secondary | ICD-10-CM

## 2014-10-06 ENCOUNTER — Ambulatory Visit
Admission: RE | Admit: 2014-10-06 | Discharge: 2014-10-06 | Disposition: A | Discharge status: Home / Self Care | Payer: BC Managed Care – PPO | Source: Ambulatory Visit

## 2014-10-06 DIAGNOSIS — Z1231 Encounter for screening mammogram for malignant neoplasm of breast: Secondary | ICD-10-CM

## 2014-10-23 ENCOUNTER — Encounter: Payer: Self-pay | Admitting: Obstetrics & Gynecology

## 2014-10-23 ENCOUNTER — Ambulatory Visit (INDEPENDENT_AMBULATORY_CARE_PROVIDER_SITE_OTHER): Payer: BC Managed Care – PPO | Admitting: Obstetrics & Gynecology

## 2014-10-23 VITALS — BP 134/82 | HR 60 | Resp 16 | Ht 66.0 in | Wt 126.0 lb

## 2014-10-23 DIAGNOSIS — Z124 Encounter for screening for malignant neoplasm of cervix: Secondary | ICD-10-CM

## 2014-10-23 DIAGNOSIS — Z01419 Encounter for gynecological examination (general) (routine) without abnormal findings: Secondary | ICD-10-CM

## 2014-10-23 DIAGNOSIS — Z0000 Encounter for general adult medical examination without abnormal findings: Secondary | ICD-10-CM

## 2014-10-23 DIAGNOSIS — N912 Amenorrhea, unspecified: Secondary | ICD-10-CM

## 2014-10-23 LAB — POCT URINALYSIS DIPSTICK
Bilirubin, UA: NEGATIVE
Blood, UA: NEGATIVE
Glucose, UA: NEGATIVE
Ketones, UA: NEGATIVE
Leukocytes, UA: NEGATIVE
Nitrite, UA: NEGATIVE
Protein, UA: NEGATIVE
Urobilinogen, UA: NEGATIVE
pH, UA: 5

## 2014-10-23 LAB — HEMOGLOBIN, FINGERSTICK: Hemoglobin, fingerstick: 13.5 g/dL (ref 12.0–16.0)

## 2014-10-23 MED ORDER — NORETHIN ACE-ETH ESTRAD-FE 1-20 MG-MCG PO TABS: 1.0000 | ORAL_TABLET | Freq: Every day | ORAL | Status: DC

## 2014-10-23 NOTE — Progress Notes (Signed)
48 y.o. G0P0000 MarriedCaucasianF here for annual exam.  Had a gastrocnemius in the winter.  Compression and nitroglycerin was needed for complete healing.  Still on OCPs.  Doesn't cycle.    No LMP recorded. Patient is not currently having periods (Reason: Oral contraceptives).          Sexually active: Yes.    The current method of family planning is OCP (estrogen/progesterone).    Exercising: Yes.    running 5 x weekly Smoker:  no  Health Maintenance: Pap:  09/13/12 WNL/negative HR HPV History of abnormal Pap:  no MMG:  10/06/14 3D-normal Colonoscopy:  none BMD:   None, consultation scheduled TDaP:  9/10 Screening Labs: today, Hb today: 13.5, Urine today: negative   reports that she has never smoked. She has never used smokeless tobacco. She reports that she drinks alcohol. She reports that she does not use illicit drugs.  Past Medical History  Diagnosis Date  . OAB (overactive bladder)     History reviewed. No pertinent past surgical history.  Current Outpatient Prescriptions  Medication Sig Dispense Refill  . Norethindrone-Ethinyl Estradiol-Fe Biphas (LO LOESTRIN FE) 1 MG-10 MCG / 10 MCG tablet Take 1 tablet by mouth daily. 3 Package 3   No current facility-administered medications for this visit.    Family History  Problem Relation Age of Onset  . Breast cancer Sister 1034    lumpectomy-A&W  . Hypertension Sister   . Infertility Sister   . Hypertension Mother   . Hypertension Father   . Heart attack Father 7244  . Hypertension Brother     ROS:  Pertinent items are noted in HPI.  Otherwise, a comprehensive ROS was negative.  Exam:   BP 134/82 mmHg  Pulse 60  Resp 16  Ht 5\' 6"  (1.676 m)  Wt 126 lb (57.153 kg)  BMI 20.35 kg/m2  LMP    Height: 5\' 6"  (167.6 cm)  Ht Readings from Last 3 Encounters:  10/23/14 5\' 6"  (1.676 m)  03/05/14 5\' 6"  (1.676 m)  01/22/14 5\' 6"  (1.676 m)    General appearance: alert, cooperative and appears stated age Head: Normocephalic,  without obvious abnormality, atraumatic Neck: no adenopathy, supple, symmetrical, trachea midline and thyroid normal to inspection and palpation Lungs: clear to auscultation bilaterally Breasts: normal appearance, no masses or tenderness Heart: regular rate and rhythm Abdomen: soft, non-tender; bowel sounds normal; no masses,  no organomegaly Extremities: extremities normal, atraumatic, no cyanosis or edema Skin: Skin color, texture, turgor normal. No rashes or lesions Lymph nodes: Cervical, supraclavicular, and axillary nodes normal. No abnormal inguinal nodes palpated Neurologic: Grossly normal   Pelvic: External genitalia:  no lesions              Urethra:  normal appearing urethra with no masses, tenderness or lesions              Bartholins and Skenes: normal                 Vagina: normal appearing vagina with normal color and discharge, no lesions              Cervix: no lesions              Pap taken: Yes.   Bimanual Exam:  Uterus:  normal size, contour, position, consistency, mobility, non-tender              Adnexa: normal adnexa and no mass, fullness, tenderness  Rectovaginal: Confirms               Anus:  normal sphincter tone, no lesions  A:  Well Woman with normal exam Amenorrhea on OCPs  P:   Mammogram yearly [facility] today.  Change back to Loestrin 1/20 Fe due to cost.  #2952mo supply/4RF Lipids, CMP, TSH, Vit D pap smear obtained today return annually or prn  An After Visit Summary was printed and given to the patient.

## 2014-10-24 LAB — TSH: TSH: 1.401 u[IU]/mL (ref 0.350–4.500)

## 2014-10-24 LAB — COMPREHENSIVE METABOLIC PANEL
ALT: 16 U/L (ref 0–35)
AST: 18 U/L (ref 0–37)
Albumin: 4.1 g/dL (ref 3.5–5.2)
Alkaline Phosphatase: 45 U/L (ref 39–117)
BUN: 15 mg/dL (ref 6–23)
CO2: 23 mEq/L (ref 19–32)
Calcium: 9.3 mg/dL (ref 8.4–10.5)
Chloride: 104 mEq/L (ref 96–112)
Creat: 0.73 mg/dL (ref 0.50–1.10)
Glucose, Bld: 95 mg/dL (ref 70–99)
Potassium: 4 mEq/L (ref 3.5–5.3)
Sodium: 137 mEq/L (ref 135–145)
Total Bilirubin: 0.6 mg/dL (ref 0.2–1.2)
Total Protein: 6.7 g/dL (ref 6.0–8.3)

## 2014-10-24 LAB — LIPID PANEL
Cholesterol: 160 mg/dL (ref 0–200)
HDL: 84 mg/dL (ref >39)
LDL Cholesterol: 62 mg/dL (ref 0–99)
Total CHOL/HDL Ratio: 1.9 Ratio
Triglycerides: 71 mg/dL (ref <150)
VLDL: 14 mg/dL (ref 0–40)

## 2014-10-24 LAB — VITAMIN D 25 HYDROXY (VIT D DEFICIENCY, FRACTURES): Vit D, 25-Hydroxy: 21 ng/mL — ABNORMAL LOW (ref 30–100)

## 2014-10-24 LAB — FOLLICLE STIMULATING HORMONE: FSH: 2.2 m[IU]/mL

## 2014-10-25 LAB — IPS PAP TEST WITH REFLEX TO HPV

## 2015-08-19 ENCOUNTER — Encounter: Payer: Self-pay | Admitting: Sports Medicine

## 2015-08-19 ENCOUNTER — Ambulatory Visit (INDEPENDENT_AMBULATORY_CARE_PROVIDER_SITE_OTHER): Payer: BLUE CROSS/BLUE SHIELD | Admitting: Sports Medicine

## 2015-08-19 VITALS — BP 122/88 | HR 57 | Ht 66.0 in | Wt 127.0 lb

## 2015-08-19 DIAGNOSIS — S92911A Unspecified fracture of right toe(s), initial encounter for closed fracture: Secondary | ICD-10-CM

## 2015-08-19 NOTE — Progress Notes (Signed)
   Subjective:    Patient ID: Whitney Frederick, female    DOB: August 22, 1966, 48 y.o.   MRN: [id number]  HPI chief complaint: Right fourth toe pain  49 year old female comes in today complaining of 4 weeks of right fourth toe pain. She stubbed her toe on a bedpost 4 weeks ago. Immediate pain and swelling. Up until about a week ago she was limping significantly. Her pain is improving but has not resolved. She localizes it to the fourth toe. She denies significant pain in her foot but did get some bruising and swelling here. She has been buddy taping her toes. She has been able to return to doing some running as well as. She denies limping currently.  Interim medical history reviewed Medications reviewed Allergies reviewed    Review of Systems As above    Objective:   Physical Exam Well-developed, well-nourished. No acute distress. Awake alert and oriented 3. Vital signs reviewed.  Right fourth toe: There is tenderness to palpation at the proximal and middle phalanx . No malrotation or angulation. Mild diffuse swelling. Flexor and extensor tendons are intact. No tenderness to palpation along the fourth metatarsal. Skin is intact. Patient is walking without significant limp.  MSK ultrasound of the right fourth toe was performed. There is a cortical break in the midportion of the middle phalanx consistent with a fracture here. There is soft callus around the fracture.      Assessment & Plan:  Healing fourth toe fracture, right foot  Patient's injury is already 41 weeks old. She is beginning to return to activity including running. I reassured her that this is a very stable fracture and should go on to heal without complication. She can buddy tape as needed for comfort. She can continue with activity as tolerated as well. I do not see the need for any further imaging unless symptoms persist. Follow-up for ongoing or recalcitrant issues.

## 2015-09-01 ENCOUNTER — Other Ambulatory Visit: Payer: Self-pay

## 2015-09-01 DIAGNOSIS — Z1231 Encounter for screening mammogram for malignant neoplasm of breast: Secondary | ICD-10-CM

## 2015-09-08 ENCOUNTER — Telehealth: Payer: Self-pay | Admitting: Obstetrics & Gynecology

## 2015-09-08 NOTE — Telephone Encounter (Signed)
Patient has the flu and a very bad cough. She has lost her voice and wants to see if Dr. Hyacinth MeekerMiller or PG would help her. She states this is my only doctor and they told me they would help me if needed. Patient is aware we do not treat for colds but insist on talking with the nurse.

## 2015-09-08 NOTE — Telephone Encounter (Signed)
Call to patient. She states she has cough, body aches and chills for 6 days. Advised patient will need to go to urgent care or emergency department for evaluation. Advised this is best for her care to be evaluated and recommended evaluation today urgent care or emergency department. Patient agreeable.  Routing to provider for final review. Patient agreeable to disposition. Will close encounter.

## 2015-09-14 ENCOUNTER — Institutional Professional Consult (permissible substitution): Payer: BLUE CROSS/BLUE SHIELD | Admitting: Internal Medicine

## 2015-09-16 ENCOUNTER — Ambulatory Visit (INDEPENDENT_AMBULATORY_CARE_PROVIDER_SITE_OTHER): Payer: BLUE CROSS/BLUE SHIELD | Admitting: Internal Medicine

## 2015-09-16 ENCOUNTER — Encounter: Payer: Self-pay | Admitting: Internal Medicine

## 2015-09-16 VITALS — BP 132/80 | HR 56 | Ht 66.0 in | Wt 125.2 lb

## 2015-09-16 DIAGNOSIS — J189 Pneumonia, unspecified organism: Secondary | ICD-10-CM | POA: Diagnosis not present

## 2015-09-16 DIAGNOSIS — Z23 Encounter for immunization: Secondary | ICD-10-CM

## 2015-09-16 DIAGNOSIS — Z8701 Personal history of pneumonia (recurrent): Secondary | ICD-10-CM | POA: Insufficient documentation

## 2015-09-16 NOTE — Patient Instructions (Signed)
ICD-9-CM ICD-10-CM   1. CAP (community acquired pneumonia) 486 J18.9     Improved clinically based on history and exam Unlikely pleural effusion on report is worse  PLAN - gradually resume normal activities   - please bring CD ROm of 09/08/15 and 09/10/15 CXR   - will reivew and call back - do CXR 2 view end of first week dec 2016  Followup  - end first week dec 2016 after CXR - see me or my NP

## 2015-09-16 NOTE — Progress Notes (Signed)
Subjective:    Patient ID: Whitney Frederick, female    DOB: 04-17-1966, 49 y.o.   MRN: [id number]  PCP  No PCP Per Patient   HPI  IOV 09/16/2015  Chief Complaint  Patient presents with  . Pulmonary Consult    Pt referred by Arvilla MeresKelly Phillips, PA, for pneumonia. Pt states had s/s with pna on 10.20.16. Pt states she feels much improved. Pt states she has not gotten back to her normal routine yet, pt states she is a runner and has not ran but walked for 2 miles today (11.2.16) and did fine. Pt c/o very mild dry cough. Pt denies SOB and CP/tightness.    49 year old extremely active and healthy female. Baseline is a nonsmoker without much medical problems or any medical problems. She runs 30 miles a week and is supremely fit. She works for the last 2 years in a [employer]. She denies any exposures to mold or dust. Reports that some several weeks ago perhaps 6 weeks ago hurt her little toe on ?  Left foot there is no ankle edema or any other issues. Once that result shows back to running. She did notice some less capacity than baseline. But otherwise well. Then abruptly on 09/03/2015 in the afternoon while at work [someone at work was a sick contact prior to this with the cold] she developed acute onset of chills. She developed high-grade temperature later that day. She also had associated dry cough. Overall she was feeling miserable and unimproved. Therefore on 09/08/2015 she [facility]. Outside medical records are not available to me [patient upset about this fact]. However she had her instructions that showed chest x-ray report of perihilar and right upper lobe pneumonia with a small right pleural effusion. She was given Rocephin 1 dose followed by Levaquin 750 mg for 7 days. CBC was reported as normal. She subsequently followed up on 09/10/2015. At this time she was beginning to feel better. Repeat chest x-ray shows slight improvement in the right upper lobe infiltrate and  stable right tiny pleural effusion. Left side was described as stable. She says that at this time she was having some  reaction to Levaquin and she was changed to doxycycline for another 10 days. She is currently on day 6 out of 10 of doxycycline. She was also given prednisone six-day taper which she is completed today or is completing it. At this point in time she feels completely at baseline and is slowly beginning to exert herself by walking.  Of note she is upset about the fact that being extremely healthy though she is picked up an illness. She is also upset about the fact that medical records were not transferred and I do not have the outside chest x-ray to view. Also of note flu test at the urgent care was negative according to her history    has a past medical history of OAB (overactive bladder) and PNA (pneumonia).   reports that she has never smoked. She has never used smokeless tobacco.  No past surgical history on file.  No Known Allergies  Immunization History  Administered Date(s) Administered  . Tdap 07/16/2009    Family History  Problem Relation Age of Onset  . Breast cancer Sister 4034    lumpectomy-A&W  . Hypertension Sister   . Infertility Sister   . Hypertension Mother   . Hypertension Father   . Heart attack Father 5144  . Hypertension Brother  Current outpatient prescriptions:  .  albuterol (PROVENTIL HFA;VENTOLIN HFA) 108 (90 BASE) MCG/ACT inhaler, Inhale 2 puffs into the lungs., Disp: , Rfl:  .  doxycycline (VIBRA-TABS) 100 MG tablet, Take 100 mg by mouth., Disp: , Rfl:  .  HYDROcodone-homatropine (HYCODAN) 5-1.5 MG/5ML syrup, Take 5 mLs by mouth., Disp: , Rfl:  .  norethindrone-ethinyl estradiol (JUNEL FE 1/20) 1-20 MG-MCG tablet, Take 1 tablet by mouth daily., Disp: 3 Package, Rfl: 4     Review of Systems  Constitutional: Negative for fever and unexpected weight change.  HENT: Positive for congestion. Negative for dental problem, ear pain,  nosebleeds, postnasal drip, rhinorrhea, sinus pressure, sneezing, sore throat and trouble swallowing.   Eyes: Negative for redness and itching.  Respiratory: Positive for cough. Negative for chest tightness, shortness of breath and wheezing.   Cardiovascular: Negative for palpitations and leg swelling.  Gastrointestinal: Negative for nausea and vomiting.  Genitourinary: Negative for dysuria.  Musculoskeletal: Negative for joint swelling.  Skin: Negative for rash.  Neurological: Negative for headaches.  Hematological: Does not bruise/bleed easily.  Psychiatric/Behavioral: Negative for dysphoric mood. The patient is not nervous/anxious.        Objective:   Physical Exam  Constitutional: She is oriented to person, place, and time. She appears well-developed and well-nourished. No distress.  HENT:  Head: Normocephalic and atraumatic.  Right Ear: External ear normal.  Left Ear: External ear normal.  Mouth/Throat: Oropharynx is clear and moist. No oropharyngeal exudate.  Eyes: Conjunctivae and EOM are normal. Pupils are equal, round, and reactive to light. Right eye exhibits no discharge. Left eye exhibits no discharge. No scleral icterus.  Neck: Normal range of motion. Neck supple. No JVD present. No tracheal deviation present. No thyromegaly present.  Cardiovascular: Normal rate, regular rhythm, normal heart sounds and intact distal pulses.  Exam reveals no gallop and no friction rub.   No murmur heard. Pulmonary/Chest: Effort normal. No respiratory distress. She has no wheezes. She has rales. She exhibits no tenderness.  Crackles present in right base  Abdominal: Soft. Bowel sounds are normal. She exhibits no distension and no mass. There is no tenderness. There is no rebound and no guarding.  Musculoskeletal: Normal range of motion. She exhibits no edema or tenderness.  Lymphadenopathy:    She has no cervical adenopathy.  Neurological: She is alert and oriented to person, place, and  time. She has normal reflexes. No cranial nerve deficit. She exhibits normal muscle tone. Coordination normal.  Skin: Skin is warm and dry. No rash noted. She is not diaphoretic. No erythema. No pallor.  Psychiatric: She has a normal mood and affect. Her behavior is normal. Judgment and thought content normal.  Vitals reviewed.   Filed Vitals:   09/16/15 1554  BP: 132/80  Pulse: 56  Height:  (1.676 m)  Weight: 125 lb 3.2 oz (56.79 kg)  SpO2: 100%         Assessment & Plan:     ICD-9-CM ICD-10-CM   1. CAP (community acquired pneumonia) 486 J18.9 DG Chest 2 View     CANCELED: DG Chest 2 View  2. Encounter for immunization Z23 Z23    She likely had community acquired pneumonia. Possibly atypical pneumonia given normal white count. But easily could've been Streptococcus pneumonia given the abrupt onset. Clinically she is improved. There was an associated tiny small pleural effusion. Clinically her air entry in the right base is good. She has some crackles suggestive of some possible basal atelectasis or infiltrate. Given  the clinical history of significant improvement and feeling baseline she requires a follow-up chest x-ray in 6-8 weeks which would put her at the end of first week of December 2016. Till the time I've asked her to slowly resume normal activities including exercise as tolerated. In the interim if she feels extra fatigue or double of new symptoms then she will quickly report to [place].  I've also asked her to bring her outside chest x-ray on a CD-ROM second view the image in case any to change my plan.  She is putting interested in knowing why she got this. Sick contact in season of the year or 2 possible factors. Beyond that are random chance. Nevertheless if she gets repeated pneumonias then we'll have the look for autoimmune disease or other etiologies for recurrent pneumonia   She verbalized and understands the plan. She is agreeable to have a flu shot today   Dr.  Kalman Shan, M.D., [facility].C.P Pulmonary and Critical Care Medicine Staff Physician [facility] Pager: [phone], If no answer or between  15:00h - 7:00h: call [phone]  09/16/2015 6:21 PM

## 2015-10-15 ENCOUNTER — Ambulatory Visit
Admission: RE | Admit: 2015-10-15 | Discharge: 2015-10-15 | Disposition: A | Discharge status: Home / Self Care | Payer: BLUE CROSS/BLUE SHIELD | Source: Ambulatory Visit

## 2015-10-15 DIAGNOSIS — Z1231 Encounter for screening mammogram for malignant neoplasm of breast: Secondary | ICD-10-CM

## 2015-10-20 ENCOUNTER — Encounter: Payer: Self-pay | Admitting: Adult Health

## 2015-10-20 ENCOUNTER — Ambulatory Visit (INDEPENDENT_AMBULATORY_CARE_PROVIDER_SITE_OTHER): Payer: BLUE CROSS/BLUE SHIELD | Admitting: Adult Health

## 2015-10-20 ENCOUNTER — Ambulatory Visit (INDEPENDENT_AMBULATORY_CARE_PROVIDER_SITE_OTHER)
Admission: RE | Admit: 2015-10-20 | Discharge: 2015-10-20 | Disposition: A | Discharge status: Home / Self Care | Payer: BLUE CROSS/BLUE SHIELD | Source: Ambulatory Visit | Attending: Adult Health | Admitting: Adult Health

## 2015-10-20 VITALS — BP 124/78 | HR 53 | Temp 98.0°F | Ht 66.0 in | Wt 126.0 lb

## 2015-10-20 DIAGNOSIS — J189 Pneumonia, unspecified organism: Secondary | ICD-10-CM

## 2015-10-20 DIAGNOSIS — Z23 Encounter for immunization: Secondary | ICD-10-CM | POA: Diagnosis not present

## 2015-10-20 NOTE — Patient Instructions (Signed)
Advance activity as tolerated.  Pneumovax vaccine today  Follow up Dr. Marchelle Gearingamaswamy in 6 months and As needed

## 2015-10-21 NOTE — Assessment & Plan Note (Signed)
Clinically improved. [facility] CXR Reviewed on CD . , CXR today shows significant imrpovement with clearance of pna , mild residual interstitial markings noted in the perihilar areas only .  She is to advance activity as tolerated  PVX vaccine today   Plan  Advance activity as tolerated.  Pneumovax vaccine today  Follow up Dr. Marchelle Gearingamaswamy in 6 months and As needed

## 2015-10-21 NOTE — Progress Notes (Signed)
Subjective:    Patient ID: Whitney Frederick, female    DOB: 11/21/1965, 49 y.o.   MRN: [id number]  HPI  49 yo never smoker , avid runner,  seen for pulmonary consult 09/16/15 with Dr. Marchelle Gearing for PNA.    10/20/15 Follow up : PNA  Pt returns for 1 month follow up for pneumonia .  She was seen last visit for pulmonary consult for pnuemonia .  She had acute illness around 10/20 went to urgent care and treated with RUL and perihilar PNA , she was treated with Levaquin  for 7 days . Serial cxr showed sight improvement . She did not tolerated levaquin and was changed to doxycycline.  She returns today feeling much improved , back to baseline. Has started running agaiin but at lighter pace/duration. No chest pain, orthopnea, edema , fever or hemoptysis.  CXR today shows clearance of PNA.    Past Medical History  Diagnosis Date  . OAB (overactive bladder)   . PNA (pneumonia)    Current Outpatient Prescriptions on File Prior to Visit  Medication Sig Dispense Refill  . norethindrone-ethinyl estradiol (JUNEL FE 1/20) 1-20 MG-MCG tablet Take 1 tablet by mouth daily. 3 Package 4  . albuterol (PROVENTIL HFA;VENTOLIN HFA) 108 (90 BASE) MCG/ACT inhaler Inhale 2 puffs into the lungs.     No current facility-administered medications on file prior to visit.       Review of Systems Constitutional:   No  weight loss, night sweats,  Fevers, chills, fatigue, or  lassitude.  HEENT:   No headaches,  Difficulty swallowing,  Tooth/dental problems, or  Sore throat,                No sneezing, itching, ear ache, nasal congestion, post nasal drip,   CV:  No chest pain,  Orthopnea, PND, swelling in lower extremities, anasarca, dizziness, palpitations, syncope.   GI  No heartburn, indigestion, abdominal pain, nausea, vomiting, diarrhea, change in bowel habits, loss of appetite, bloody stools.   Resp: No shortness of breath with exertion or at rest.  No excess mucus, no productive cough,  No  non-productive cough,  No coughing up of blood.  No change in color of mucus.  No wheezing.  No chest wall deformity  Skin: no rash or lesions.  GU: no dysuria, change in color of urine, no urgency or frequency.  No flank pain, no hematuria   MS:  No joint pain or swelling.  No decreased range of motion.  No back pain.  Psych:  No change in mood or affect. No depression or anxiety.  No memory loss.         Objective:   Physical Exam GEN: A/Ox3; pleasant , NAD, well nourished   HEENT:  [place]/AT,  EACs-clear, TMs-wnl, NOSE-clear, THROAT-clear, no lesions, no postnasal drip or exudate noted.   NECK:  Supple w/ fair ROM; no JVD; normal carotid impulses w/o bruits; no thyromegaly or nodules palpated; no lymphadenopathy.  RESP  Clear  P & A; w/o, wheezes/ rales/ or rhonchi.no accessory muscle use, no dullness to percussion  CARD:  RRR, no m/r/g  , no peripheral edema, pulses intact, no cyanosis or clubbing.  GI:   Soft & nt; nml bowel sounds; no organomegaly or masses detected.  Musco: Warm bil, no deformities or joint swelling noted.   Neuro: alert, no focal deficits noted.    Skin: Warm, no lesions or rashes   CXR 10/21/2015   The lungs are mildly hyperinflated with hemidiaphragm  flattening. There is no focal infiltrate. The interstitial markings are mildly increased predominantly in the perihilar regions     Assessment & Plan:

## 2015-10-26 ENCOUNTER — Encounter: Payer: Self-pay | Admitting: Adult Health

## 2015-10-27 ENCOUNTER — Ambulatory Visit: Payer: Self-pay | Admitting: Nurse Practitioner

## 2015-10-27 ENCOUNTER — Ambulatory Visit (INDEPENDENT_AMBULATORY_CARE_PROVIDER_SITE_OTHER): Payer: BLUE CROSS/BLUE SHIELD | Admitting: Nurse Practitioner

## 2015-10-27 ENCOUNTER — Encounter: Payer: Self-pay | Admitting: Nurse Practitioner

## 2015-10-27 VITALS — BP 126/76 | HR 56 | Ht 66.0 in | Wt 126.0 lb

## 2015-10-27 DIAGNOSIS — Z01419 Encounter for gynecological examination (general) (routine) without abnormal findings: Secondary | ICD-10-CM | POA: Diagnosis not present

## 2015-10-27 DIAGNOSIS — Z0000 Encounter for general adult medical examination without abnormal findings: Secondary | ICD-10-CM | POA: Diagnosis not present

## 2015-10-27 DIAGNOSIS — R8290 Unspecified abnormal findings in urine: Secondary | ICD-10-CM | POA: Diagnosis not present

## 2015-10-27 DIAGNOSIS — N951 Menopausal and female climacteric states: Secondary | ICD-10-CM | POA: Diagnosis not present

## 2015-10-27 LAB — CBC WITH DIFFERENTIAL/PLATELET
Basophils Absolute: 0 10*3/uL (ref 0.0–0.1)
Basophils Relative: 0 % (ref 0–1)
EOS ABS: 0.1 10*3/uL (ref 0.0–0.7)
EOS PCT: 1 % (ref 0–5)
HEMATOCRIT: 39.2 % (ref 36.0–46.0)
Hemoglobin: 13.4 g/dL (ref 12.0–15.0)
LYMPHS ABS: 2.9 10*3/uL (ref 0.7–4.0)
LYMPHS PCT: 42 % (ref 12–46)
MCH: 32.4 pg (ref 26.0–34.0)
MCHC: 34.2 g/dL (ref 30.0–36.0)
MCV: 94.9 fL (ref 78.0–100.0)
MONO ABS: 0.6 10*3/uL (ref 0.1–1.0)
MPV: 10.1 fL (ref 8.6–12.4)
Monocytes Relative: 8 % (ref 3–12)
Neutro Abs: 3.4 10*3/uL (ref 1.7–7.7)
Neutrophils Relative %: 49 % (ref 43–77)
PLATELETS: 271 10*3/uL (ref 150–400)
RBC: 4.13 MIL/uL (ref 3.87–5.11)
RDW: 13.1 % (ref 11.5–15.5)
WBC: 6.9 10*3/uL (ref 4.0–10.5)

## 2015-10-27 LAB — POCT URINALYSIS DIPSTICK
BILIRUBIN UA: NEGATIVE
Blood, UA: NEGATIVE
Glucose, UA: NEGATIVE
KETONES UA: NEGATIVE
Nitrite, UA: NEGATIVE
PH UA: 6
Protein, UA: NEGATIVE
Urobilinogen, UA: NEGATIVE

## 2015-10-27 MED ORDER — NORETHIN ACE-ETH ESTRAD-FE 1-20 MG-MCG PO TABS: 1.0000 | ORAL_TABLET | Freq: Every day | ORAL | Status: DC

## 2015-10-27 NOTE — Progress Notes (Signed)
Patient ID: Whitney Frederick, female   DOB: 09-08-1966, 49 y.o.   MRN: [id number]  49 y.o. G0P0000 Married  Caucasian Fe here for annual exam.  Pneumonia was diagnosed 09/08/15. She feels well now and back to running.  Her husband is having some health issues.  Patient's last menstrual period was 10/03/2015.          Sexually active: Yes.    The current method of family planning is post menopausal status.    Exercising: Yes.    running Smoker:  no  Health Maintenance: Pap: 09/13/12 negative with negative HR HPV History of abnormal Pap: no MMG:10/15/15, 3D, Bi-Rads 1: Negative Colonoscopy: 8/ 2016 2 polyps benign, repeat in 5 years [employer]:UXLKG TDaP: 07/16/09 Labs: HB: 13.3  Urine: Trace Leuks   reports that she has never smoked. She has never used smokeless tobacco. She reports that she drinks alcohol. She reports that she does not use illicit drugs.  Past Medical History  Diagnosis Date  . OAB (overactive bladder)   . PNA (pneumonia)     History reviewed. No pertinent past surgical history.  Current Outpatient Prescriptions  Medication Sig Dispense Refill  . norethindrone-ethinyl estradiol (JUNEL FE 1/20) 1-20 MG-MCG tablet Take 1 tablet by mouth daily. 3 Package 4   No current facility-administered medications for this visit.    Family History  Problem Relation Age of Onset  . Breast cancer Sister 72    lumpectomy-A&W  . Hypertension Mother   . Hypertension Father   . Heart attack Father 26  . Hypertension Brother   . Hypertension Sister   . Hypertension Sister   . Hyperlipidemia Brother     ROS:  Pertinent items are noted in HPI.  Otherwise, a comprehensive ROS was negative.  Exam:   BP 126/76 mmHg  Pulse 56  Ht  (1.676 m)  Wt 126 lb (57.153 kg)  BMI 20.35 kg/m2  LMP 10/03/2015 Height:  (167.6 cm) Ht Readings from Last 3 Encounters:  10/27/15  (1.676 m)  10/20/15  (1.676 m)  09/16/15  (1.676 m)    General appearance: alert,  cooperative and appears stated age Head: Normocephalic, without obvious abnormality, atraumatic Neck: no adenopathy, supple, symmetrical, trachea midline and thyroid normal to inspection and palpation Lungs: clear to auscultation bilaterally Breasts: normal appearance, no masses or tenderness Heart: regular rate and rhythm Abdomen: soft, non-tender; no masses,  no organomegaly Extremities: extremities normal, atraumatic, no cyanosis or edema Skin: Skin color, texture, turgor normal. No rashes or lesions Lymph nodes: Cervical, supraclavicular, and axillary nodes normal. No abnormal inguinal nodes palpated Neurologic: Grossly normal   Pelvic: External genitalia:  no lesions              Urethra:  normal appearing urethra with no masses, tenderness or lesions              Bartholin's and Skene's: normal                 Vagina: normal appearing vagina with normal color and discharge, no lesions              Cervix: anteverted with stenotic os              Pap taken: Yes.   Bimanual Exam:  Uterus:  normal size, contour, position, consistency, mobility, non-tender              Adnexa: no mass, fullness, tenderness  Rectovaginal: Confirms               Anus:  normal sphincter tone, no lesions  Chaperone present: yes  A:  Well Woman with normal exam  Amenorrhea on OCPs - will recheck FSH this is her week off active pills  Recent pneumonia  R/O UTI - asymptomatic   P:   Reviewed health and wellness pertinent to exam  Pap smear as above  Mammogram is due 10/2016  Will follow with labs and culture  Counseled on breast self exam, mammography screening, adequate intake of calcium and vitamin D, diet and exercise return annually or prn  An After Visit Summary was printed and given to the patient.

## 2015-10-27 NOTE — Telephone Encounter (Signed)
TP - please advise. Thanks. 

## 2015-10-27 NOTE — Patient Instructions (Signed)

## 2015-10-28 LAB — COMPREHENSIVE METABOLIC PANEL
ALBUMIN: 4 g/dL (ref 3.6–5.1)
ALT: 25 U/L (ref 6–29)
AST: 25 U/L (ref 10–35)
Alkaline Phosphatase: 58 U/L (ref 33–130)
BILIRUBIN TOTAL: 0.6 mg/dL (ref 0.2–1.2)
BUN: 16 mg/dL (ref 7–25)
CHLORIDE: 106 mmol/L (ref 98–110)
CO2: 22 mmol/L (ref 20–31)
CREATININE: 0.58 mg/dL (ref 0.50–1.05)
Calcium: 9.1 mg/dL (ref 8.6–10.4)
Glucose, Bld: 88 mg/dL (ref 65–99)
Potassium: 4 mmol/L (ref 3.5–5.3)
SODIUM: 140 mmol/L (ref 135–146)
TOTAL PROTEIN: 6.7 g/dL (ref 6.1–8.1)

## 2015-10-28 LAB — LIPID PANEL
Cholesterol: 177 mg/dL (ref 125–200)
HDL: 99 mg/dL (ref >=46)
LDL CALC: 67 mg/dL (ref <130)
Total CHOL/HDL Ratio: 1.8 Ratio (ref <=5.0)
Triglycerides: 54 mg/dL (ref <150)
VLDL: 11 mg/dL (ref <30)

## 2015-10-28 LAB — HIV ANTIBODY (ROUTINE TESTING W REFLEX): HIV: NONREACTIVE

## 2015-10-28 LAB — IPS PAP TEST WITH HPV

## 2015-10-28 LAB — HEMOGLOBIN, FINGERSTICK: HEMOGLOBIN, FINGERSTICK: 13.3 g/dL (ref 12.0–16.0)

## 2015-10-28 LAB — FOLLICLE STIMULATING HORMONE: FSH: 29 m[IU]/mL

## 2015-10-28 LAB — TSH: TSH: 1.474 u[IU]/mL (ref 0.350–4.500)

## 2015-10-28 LAB — HEPATITIS C ANTIBODY: HCV AB: NEGATIVE

## 2015-10-28 LAB — VITAMIN D 25 HYDROXY (VIT D DEFICIENCY, FRACTURES): VIT D 25 HYDROXY: 27 ng/mL — AB (ref 30–100)

## 2015-10-28 NOTE — Progress Notes (Signed)
Encounter reviewed Eljay Lave, MD   

## 2015-10-29 LAB — URINE CULTURE
Colony Count: NO GROWTH
Organism ID, Bacteria: NO GROWTH

## 2015-11-04 NOTE — Telephone Encounter (Signed)
Please advise thanks.

## 2015-12-03 ENCOUNTER — Ambulatory Visit: Payer: BC Managed Care – PPO | Admitting: Obstetrics & Gynecology

## 2016-06-24 ENCOUNTER — Other Ambulatory Visit: Payer: Self-pay | Admitting: Obstetrics & Gynecology

## 2016-06-24 DIAGNOSIS — Z1231 Encounter for screening mammogram for malignant neoplasm of breast: Secondary | ICD-10-CM

## 2016-10-18 ENCOUNTER — Ambulatory Visit
Admission: RE | Admit: 2016-10-18 | Discharge: 2016-10-18 | Disposition: A | Discharge status: Home / Self Care | Payer: BLUE CROSS/BLUE SHIELD | Source: Ambulatory Visit | Attending: Obstetrics & Gynecology | Admitting: Obstetrics & Gynecology

## 2016-10-18 DIAGNOSIS — Z1231 Encounter for screening mammogram for malignant neoplasm of breast: Secondary | ICD-10-CM | POA: Diagnosis not present

## 2016-11-01 ENCOUNTER — Telehealth: Payer: Self-pay

## 2016-11-01 ENCOUNTER — Encounter: Payer: Self-pay | Admitting: Obstetrics & Gynecology

## 2016-11-01 ENCOUNTER — Ambulatory Visit (INDEPENDENT_AMBULATORY_CARE_PROVIDER_SITE_OTHER): Payer: BLUE CROSS/BLUE SHIELD | Admitting: Obstetrics & Gynecology

## 2016-11-01 VITALS — BP 128/82 | HR 60 | Resp 12 | Ht 65.75 in | Wt 125.8 lb

## 2016-11-01 DIAGNOSIS — Z9189 Other specified personal risk factors, not elsewhere classified: Secondary | ICD-10-CM

## 2016-11-01 DIAGNOSIS — N912 Amenorrhea, unspecified: Secondary | ICD-10-CM

## 2016-11-01 DIAGNOSIS — Z803 Family history of malignant neoplasm of breast: Secondary | ICD-10-CM

## 2016-11-01 DIAGNOSIS — Z0000 Encounter for general adult medical examination without abnormal findings: Secondary | ICD-10-CM | POA: Diagnosis not present

## 2016-11-01 DIAGNOSIS — Z01419 Encounter for gynecological examination (general) (routine) without abnormal findings: Secondary | ICD-10-CM | POA: Diagnosis not present

## 2016-11-01 LAB — POCT URINALYSIS DIPSTICK
BILIRUBIN UA: NEGATIVE
Blood, UA: NEGATIVE
Glucose, UA: NEGATIVE
KETONES UA: NEGATIVE
LEUKOCYTES UA: NEGATIVE
Nitrite, UA: NEGATIVE
PH UA: 5
Protein, UA: NEGATIVE
Urobilinogen, UA: NEGATIVE

## 2016-11-01 NOTE — Progress Notes (Addendum)
50 y.o. G0P0000 Married Caucasian F here for annual exam.  Doing well.  No vaginal bleeding.  A second sister was diagnosed with breast cancer.  Both sisters have undergone genetic testing which was negative.    No LMP recorded. Patient is not currently having periods (Reason: Oral contraceptives).          Sexually active: Yes.    The current method of family planning is OCP (estrogen/progesterone).    Exercising: Yes.    running Smoker:  no  Health Maintenance: Pap:  10/27/15 negative, HR HPV negative History of abnormal Pap:  no MMG:  10/18/16 BIRADS 1 negative  Colonoscopy:  08/16 polyps- repeat 5 years  BMD:   never TDaP:  07/16/09  Pneumonia vaccine(s):  10/20/15  Zostavax:  never  Hep C testing: 10/27/15 negative Screening Labs: drawn today, Hb today: drawn today, Urine today: normal    reports that she has never smoked. She has never used smokeless tobacco. She reports that she drinks alcohol. She reports that she does not use drugs.  Past Medical History:  Diagnosis Date  . OAB (overactive bladder)   . PNA (pneumonia)     History reviewed. No pertinent surgical history.  Current Outpatient Prescriptions  Medication Sig Dispense Refill  . norethindrone-ethinyl estradiol (JUNEL FE 1/20) 1-20 MG-MCG tablet Take 1 tablet by mouth daily. 3 Package 4   No current facility-administered medications for this visit.     Family History  Problem Relation Age of Onset  . Breast cancer Sister 3333    lumpectomy-A&W  . Hypertension Mother   . Hypertension Father   . Heart attack Father 3244  . Hypertension Brother   . Hypertension Sister   . Breast cancer Sister 6163    bilateral mastectomy- 2017   . Hypertension Sister   . Hyperlipidemia Brother     ROS:  Pertinent items are noted in HPI.  Otherwise, a comprehensive ROS was negative.  Exam:   BP 128/82 (BP Location: Right Arm, Patient Position: Sitting, Cuff Size: Normal)   Pulse 60   Resp 12   Ht 5' 5.75" (1.67 m)    Wt 125 lb 12.8 oz (57.1 kg)   BMI 20.46 kg/m   Weight change: -1#  Height: 5' 5.75" (167 cm)  Ht Readings from Last 3 Encounters:  11/01/16 5' 5.75" (1.67 m)  10/27/15 5\' 6"  (1.676 m)  10/20/15 5\' 6"  (1.676 m)   General appearance: alert, cooperative and appears stated age Head: Normocephalic, without obvious abnormality, atraumatic Neck: no adenopathy, supple, symmetrical, trachea midline and thyroid normal to inspection and palpation Lungs: clear to auscultation bilaterally Breasts: normal appearance, no masses or tenderness Heart: regular rate and rhythm Abdomen: soft, non-tender; bowel sounds normal; no masses,  no organomegaly Extremities: extremities normal, atraumatic, no cyanosis or edema Skin: Skin color, texture, turgor normal. No rashes or lesions Lymph nodes: Cervical, supraclavicular, and axillary nodes normal. No abnormal inguinal nodes palpated Neurologic: Grossly normal  Pelvic: External genitalia:  no lesions              Urethra:  normal appearing urethra with no masses, tenderness or lesions              Bartholins and Skenes: normal                 Vagina: normal appearing vagina with normal color and discharge, no lesions              Cervix: no lesions  Pap taken: No. Bimanual Exam:  Uterus:  normal size, contour, position, consistency, mobility, non-tender              Adnexa: normal adnexa and no mass, fullness, tenderness               Rectovaginal: Confirms               Anus:  normal sphincter tone, no lesions  Chaperone was present for exam.  A:  Well Woman with normal exam Amenorrhea on OCPs - will recheck FSH this year. Two sisters with breast cancer.  Dondra SpryGail model assessment today for pt is >29% risk for breast cancer.  P:        Doing 3D MMG.  Will see if can get MRI approved. Neg pap and neg HR HPV 2016.  No pap today. FSH and Vit D obtained today No rx for OCP given today Return annually or prn  Addendum:  Pt was called four  times between end of December and January to try and schedule [facility].  Pt did not call them back.

## 2016-11-01 NOTE — Telephone Encounter (Signed)
Order placed for bilateral breast MRI w wo contrast [facility]. Order to be precerted before scheduling.   Whitney BearsMary S Miller, MD  Whitney AskewKaitlyn E Hines, RN        Pt has two sisters with breast cancer, one <50 at diagnosis. She has lifetime risk of >29%. I'd like to try and schedule and precert an MRI for her. Thanks.    Routing to [employer]zy Frederick for [employer].

## 2016-11-02 LAB — FOLLICLE STIMULATING HORMONE: FSH: 23.5 m[IU]/mL

## 2016-11-02 LAB — VITAMIN D 25 HYDROXY (VIT D DEFICIENCY, FRACTURES): Vit D, 25-Hydroxy: 31 ng/mL (ref 30–100)

## 2016-11-03 ENCOUNTER — Ambulatory Visit: Payer: BLUE CROSS/BLUE SHIELD | Admitting: Nurse Practitioner

## 2016-11-03 ENCOUNTER — Ambulatory Visit: Payer: BLUE CROSS/BLUE SHIELD | Admitting: Certified Nurse Midwife

## 2016-12-15 NOTE — Telephone Encounter (Signed)
Dr.Miller, [facility] has called this patient and left voicemails to get her scheduled for bilateral breast MRI on 12/20, 1/3, 1/10, and 1/18 without return calls. Please advise next steps.

## 2016-12-15 NOTE — Telephone Encounter (Signed)
Pt was clearly aware of recommendation made at [facility].  Multiple phone calls have been made.  I have addended her [facility] not to include the number of times she was called.  Ok to close encounter and remove any orders that need to be removed.  Thanks.

## 2016-12-16 NOTE — Telephone Encounter (Signed)
Reviewed with Harland DingwallSuzy Dixon. Note to be placed in referral and removed. Will close encounter.

## 2016-12-21 ENCOUNTER — Other Ambulatory Visit: Payer: Self-pay | Admitting: Nurse Practitioner

## 2016-12-21 NOTE — Telephone Encounter (Signed)
Medication refill request: JUNEL FE Last AEX:  11/01/16 SM Next AEX: 11/20/17 Last MMG (if hormonal medication request): 10/18/16 BIRADS 1 negative Refill authorized: 10/27/15 #3packs w/4 refills; today please advise

## 2016-12-21 NOTE — Telephone Encounter (Signed)
Pt advised to stop pills [facility].  Refill declined.

## 2017-02-23 ENCOUNTER — Ambulatory Visit: Payer: BLUE CROSS/BLUE SHIELD | Admitting: Obstetrics & Gynecology

## 2017-03-03 DIAGNOSIS — H04123 Dry eye syndrome of bilateral lacrimal glands: Secondary | ICD-10-CM | POA: Diagnosis not present

## 2017-11-17 ENCOUNTER — Encounter (HOSPITAL_BASED_OUTPATIENT_CLINIC_OR_DEPARTMENT_OTHER): Payer: Self-pay | Admitting: *Deleted

## 2017-11-17 ENCOUNTER — Other Ambulatory Visit: Payer: Self-pay

## 2017-11-17 ENCOUNTER — Emergency Department (HOSPITAL_BASED_OUTPATIENT_CLINIC_OR_DEPARTMENT_OTHER)
Admission: EM | Admit: 2017-11-17 | Discharge: 2017-11-17 | Disposition: A | Discharge status: Home / Self Care | Payer: BLUE CROSS/BLUE SHIELD | Attending: Emergency Medicine | Admitting: Emergency Medicine

## 2017-11-17 DIAGNOSIS — S0101XA Laceration without foreign body of scalp, initial encounter: Secondary | ICD-10-CM | POA: Insufficient documentation

## 2017-11-17 DIAGNOSIS — S025XXA Fracture of tooth (traumatic), initial encounter for closed fracture: Secondary | ICD-10-CM | POA: Diagnosis not present

## 2017-11-17 DIAGNOSIS — R55 Syncope and collapse: Secondary | ICD-10-CM | POA: Diagnosis not present

## 2017-11-17 DIAGNOSIS — Y929 Unspecified place or not applicable: Secondary | ICD-10-CM | POA: Insufficient documentation

## 2017-11-17 DIAGNOSIS — Y999 Unspecified external cause status: Secondary | ICD-10-CM | POA: Diagnosis not present

## 2017-11-17 DIAGNOSIS — W1789XA Other fall from one level to another, initial encounter: Secondary | ICD-10-CM | POA: Diagnosis not present

## 2017-11-17 DIAGNOSIS — R4020 Unspecified coma: Secondary | ICD-10-CM | POA: Diagnosis not present

## 2017-11-17 DIAGNOSIS — Y939 Activity, unspecified: Secondary | ICD-10-CM | POA: Diagnosis not present

## 2017-11-17 DIAGNOSIS — S0181XA Laceration without foreign body of other part of head, initial encounter: Secondary | ICD-10-CM | POA: Diagnosis not present

## 2017-11-17 LAB — URINALYSIS, ROUTINE W REFLEX MICROSCOPIC
BILIRUBIN URINE: NEGATIVE
GLUCOSE, UA: NEGATIVE mg/dL
Ketones, ur: NEGATIVE mg/dL
Nitrite: NEGATIVE
PROTEIN: NEGATIVE mg/dL
Specific Gravity, Urine: >1.03 — ABNORMAL HIGH (ref 1.005–1.030)
pH: 6 (ref 5.0–8.0)

## 2017-11-17 LAB — URINALYSIS, MICROSCOPIC (REFLEX)

## 2017-11-17 LAB — CBC
HCT: 36.3 % (ref 36.0–46.0)
Hemoglobin: 12.2 g/dL (ref 12.0–15.0)
MCH: 31.8 pg (ref 26.0–34.0)
MCHC: 33.6 g/dL (ref 30.0–36.0)
MCV: 94.5 fL (ref 78.0–100.0)
PLATELETS: 244 10*3/uL (ref 150–400)
RBC: 3.84 MIL/uL — ABNORMAL LOW (ref 3.87–5.11)
RDW: 12.2 % (ref 11.5–15.5)
WBC: 8.4 10*3/uL (ref 4.0–10.5)

## 2017-11-17 LAB — BASIC METABOLIC PANEL
Anion gap: 8 (ref 5–15)
BUN: 17 mg/dL (ref 6–20)
CALCIUM: 9.1 mg/dL (ref 8.9–10.3)
CHLORIDE: 108 mmol/L (ref 101–111)
CO2: 24 mmol/L (ref 22–32)
CREATININE: 0.7 mg/dL (ref 0.44–1.00)
GFR calc Af Amer: >60 mL/min (ref >60)
GFR calc non Af Amer: >60 mL/min (ref >60)
GLUCOSE: 109 mg/dL — AB (ref 65–99)
Potassium: 3.5 mmol/L (ref 3.5–5.1)
Sodium: 140 mmol/L (ref 135–145)

## 2017-11-17 LAB — CBG MONITORING, ED: GLUCOSE-CAPILLARY: 78 mg/dL (ref 65–99)

## 2017-11-17 MED ORDER — SODIUM CHLORIDE 0.9 % IV BOLUS (SEPSIS)
500.0000 mL | Freq: Once | INTRAVENOUS | Status: AC
  Administered 2017-11-17: 500 mL via INTRAVENOUS

## 2017-11-17 NOTE — ED Notes (Signed)
ED Provider at bedside. 

## 2017-11-17 NOTE — ED Triage Notes (Signed)
Pt sent here from [facility] for syncopal episode last pm. Injury head and mouth

## 2017-11-17 NOTE — ED Provider Notes (Signed)
[facility] EMERGENCY DEPARTMENT Provider Note  CSN: [id number] Arrival date & time: 11/17/17 1359  Chief Complaint(s) Loss of Consciousness  HPI Whitney Frederick is a 52 y.o. female   The history is provided by the patient.  Loss of Consciousness   This is a new problem. The current episode started 12 to 24 hours ago. Episode frequency: twice. The problem has been resolved. She lost consciousness for a period of 1 to 5 minutes. Associated with: Patient reports that she donated blood earlier in the day.  Believe she did not hydrate well afterwards and then had 2 alcoholic drinks in the night. Associated symptoms include light-headedness, malaise/fatigue and visual change (Tunnel vision). Pertinent negatives include bladder incontinence, chest pain, confusion, congestion, diaphoresis, fever, headaches, nausea, palpitations and vomiting. She has tried nothing for the symptoms. Her past medical history does not include CAD, CVA, DM, HTN, seizures, TIA or vertigo.   Patient reports that due to her syncopized and, she hit her head on the floor resulting in a scalp laceration in the occiput.  After she came to she was trying to clean up the blood and had another syncopal episode causing her to fall forward and hit her face, resulting in dental fractures.  Patient reports that she went to the orthodontist prior to coming to the emergency department who are managing her dental injuries.  Currently she denies any headache, dizziness, visual changes, change in mental status, difficulty ambulating.  Denies any other physical complaints. Past Medical History Past Medical History:  Diagnosis Date  . OAB (overactive bladder)   . PNA (pneumonia)    Patient Active Problem List   Diagnosis Date Noted  . CAP (community acquired pneumonia) 09/16/2015  . Ankle pain, right 01/22/2014  . Gastrocnemius muscle tear 12/10/2013  . Knee crepitus 10/09/2012  . Low back pain 10/09/2012  . Posterior  tibial tendinitis of left leg 08/29/2012  . Loss of transverse plantar arch 08/29/2012  . ACHILLES BURSITIS OR TENDINITIS 01/25/2011  . ACHILLES TENDON TEAR 06/03/2010   Home Medication(s) Prior to Admission medications   Not on File                                                                                                                                    Past Surgical History History reviewed. No pertinent surgical history. Family History Family History  Problem Relation Age of Onset  . Breast cancer Sister 3833       lumpectomy-A&W  . Hypertension Mother   . Hypertension Father   . Heart attack Father 344  . Hypertension Brother   . Hypertension Sister   . Breast cancer Sister 263       bilateral mastectomy- 2017   . Hypertension Sister   . Hyperlipidemia Brother     Social History Social History   Tobacco Use  . Smoking status: Never Smoker  . Smokeless tobacco: Never Used  Substance  Use Topics  . Alcohol use: Yes    Alcohol/week: 1.8 oz    Types: 3 Glasses of wine per week    Comment: ~10 drinks per week  . Drug use: No   Allergies Patient has no known allergies.  Review of Systems Review of Systems  Constitutional: Positive for malaise/fatigue. Negative for diaphoresis and fever.  HENT: Negative for congestion.   Cardiovascular: Positive for syncope. Negative for chest pain and palpitations.  Gastrointestinal: Negative for nausea and vomiting.  Genitourinary: Negative for bladder incontinence.  Neurological: Positive for light-headedness. Negative for headaches.  Psychiatric/Behavioral: Negative for confusion.   All other systems are reviewed and are negative for acute change except as noted in the HPI  Physical Exam Vital Signs  I have reviewed the triage vital signs BP 133/85 (BP Location: Left Arm)   Pulse 61   Temp 98.1 F (36.7 C) (Oral)   Resp 16   Ht 5\' 6"  (1.676 m)   Wt 57.6 kg (127 lb)   LMP 10/03/2015   SpO2 99%   BMI 20.50 kg/m    Physical Exam  Constitutional: She is oriented to person, place, and time. She appears well-developed and well-nourished. No distress.  HENT:  Head: Normocephalic.    Nose: Nose normal.  Various grade II dental fractures to upper and lower incisors. No bleeding. No malocclusion. No midface instability.  Eyes: Conjunctivae and EOM are normal. Pupils are equal, round, and reactive to light. Right eye exhibits no discharge. Left eye exhibits no discharge. No scleral icterus.  Neck: Normal range of motion. Neck supple.  Cardiovascular: Normal rate and regular rhythm. Exam reveals no gallop and no friction rub.  No murmur heard. Pulmonary/Chest: Effort normal and breath sounds normal. No stridor. No respiratory distress. She has no rales.  Abdominal: Soft. She exhibits no distension. There is no tenderness.  Musculoskeletal: She exhibits no edema or tenderness.  Neurological: She is alert and oriented to person, place, and time.  Equal 5 out of 5 strength in all extremities and symmetric.   Skin: Skin is warm and dry. No rash noted. She is not diaphoretic. No erythema.  Psychiatric: She has a normal mood and affect.  Vitals reviewed.   ED Results and Treatments Labs (all labs ordered are listed, but only abnormal results are displayed) Labs Reviewed  BASIC METABOLIC PANEL - Abnormal; Notable for the following components:      Result Value   Glucose, Bld 109 (*)    All other components within normal limits  CBC - Abnormal; Notable for the following components:   RBC 3.84 (*)    All other components within normal limits  URINALYSIS, ROUTINE W REFLEX MICROSCOPIC - Abnormal; Notable for the following components:   APPearance CLOUDY (*)    Specific Gravity, Urine >1.030 (*)    Hgb urine dipstick TRACE (*)    Leukocytes, UA MODERATE (*)    All other components within normal limits  URINALYSIS, MICROSCOPIC (REFLEX) - Abnormal; Notable for the following components:   Bacteria, UA FEW  (*)    Squamous Epithelial / LPF 0-5 (*)    All other components within normal limits  CBG MONITORING, ED  EKG  EKG Interpretation  Date/Time:  Friday November 17 2017 15:28:39 EST Ventricular Rate:  67 PR Interval:    QRS Duration: 84 QT Interval:  467 QTC Calculation: 493 R Axis:   62 Text Interpretation:  Sinus rhythm Consider left ventricular hypertrophy Borderline prolonged QT interval, obscured due to U waves No old tracing to compare Reconfirmed by Drema Pry [phone]) on 11/17/2017 4:40:40 PM      Radiology No results found. Pertinent labs & imaging results that were available during my care of the patient were reviewed by me and considered in my medical decision making (see chart for details).  Medications Ordered in ED Medications  sodium chloride 0.9 % bolus 500 mL (0 mLs Intravenous Stopped 11/17/17 1603)                                                                                                                                    Procedures .Marland KitchenLaceration Repair Date/Time: 11/17/2017 4:44 PM Performed by: Nira Conn, MD Authorized by: Nira Conn, MD   Consent:    Consent obtained:  Verbal   Consent given by:  Patient   Risks discussed:  Pain and poor cosmetic result   Alternatives discussed:  Delayed treatment Anesthesia (see MAR for exact dosages):    Anesthesia method:  None Laceration details:    Location:  Scalp   Scalp location:  Occipital   Length (cm):  1.5   Depth (mm):  3 Repair type:    Repair type:  Simple Exploration:    Wound exploration: wound explored through full range of motion     Contaminated: no   Treatment:    Amount of cleaning:  Extensive   Irrigation solution:  Sterile saline   Irrigation volume:  500 cc   Irrigation method:  Syringe   Visualized foreign bodies/material removed: no   Skin  repair:    Repair method:  Staples   Number of staples:  1 Approximation:    Approximation:  Loose Post-procedure details:    Dressing:  Antibiotic ointment   Patient tolerance of procedure:  Tolerated well, no immediate complications    (including critical care time)  Medical Decision Making / ED Course I have reviewed the nursing notes for this encounter and the patient's prior records (if available in EHR or on provided paperwork).    Syncopal episode likely secondary to dehydration/vasovagal.  EKG without acute ischemic changes, dysrhythmias, blocks.  Patient does have borderline QT prolongation which is obscured by U waves.  Given his change, electrolytes were obtained which were grossly reassuring.  No evidence that would be concerning for epidural/subdural hematoma.  No indication for CT imaging at this time.  Wound was thoroughly irrigated and closed as above.  Patient was provided with IV fluids, oral fluids and nutrition.  The patient appears reasonably screened and/or stabilized for discharge and I doubt any other medical condition or other [facility] requiring  further screening, evaluation, or treatment in the ED at this time prior to discharge.  The patient is safe for discharge with strict return precautions.   Final Clinical Impression(s) / ED Diagnoses Final diagnoses:  Syncope and collapse  Laceration of scalp, initial encounter  Closed fracture of tooth, initial encounter    Disposition: Discharge  Condition: Good  I have discussed the results, Dx and Tx plan with the patient and family who expressed understanding and agree(s) with the plan. Discharge instructions discussed at great length. The patient and family were given strict return precautions who verbalized understanding of the instructions. No further questions at time of discharge.    ED Discharge Orders    None       Follow Up: [facility] [address] [id number] [address] [place] [address] [phone]  in 5-7 days, For suture removal     This chart was dictated using voice recognition software.  Despite best efforts to proofread,  errors can occur which can change the documentation meaning.   Nira Conn, MD 11/17/17 1650

## 2017-11-17 NOTE — ED Notes (Signed)
Pt ambulatory to and from restroom unassisted, in NAD.

## 2017-11-17 NOTE — ED Notes (Signed)
Pt on cardiac monitoring and auto VS 

## 2017-11-20 ENCOUNTER — Ambulatory Visit: Payer: BLUE CROSS/BLUE SHIELD | Admitting: Obstetrics & Gynecology

## 2017-11-24 ENCOUNTER — Emergency Department (HOSPITAL_BASED_OUTPATIENT_CLINIC_OR_DEPARTMENT_OTHER)
Admission: EM | Admit: 2017-11-24 | Discharge: 2017-11-24 | Disposition: A | Discharge status: Home / Self Care | Payer: BLUE CROSS/BLUE SHIELD | Attending: Emergency Medicine | Admitting: Emergency Medicine

## 2017-11-24 ENCOUNTER — Other Ambulatory Visit: Payer: Self-pay

## 2017-11-24 ENCOUNTER — Encounter (HOSPITAL_BASED_OUTPATIENT_CLINIC_OR_DEPARTMENT_OTHER): Payer: Self-pay | Admitting: *Deleted

## 2017-11-24 DIAGNOSIS — Z5321 Procedure and treatment not carried out due to patient leaving prior to being seen by health care provider: Secondary | ICD-10-CM | POA: Diagnosis not present

## 2017-11-24 DIAGNOSIS — X58XXXA Exposure to other specified factors, initial encounter: Secondary | ICD-10-CM | POA: Insufficient documentation

## 2017-11-24 DIAGNOSIS — S0101XD Laceration without foreign body of scalp, subsequent encounter: Secondary | ICD-10-CM | POA: Insufficient documentation

## 2017-11-24 NOTE — ED Notes (Signed)
Pt is mad that she was not taken straight back to a room. Explained there are no empty rooms at this time but we are doing everything we can to expedite her care. She was told we could give her a staple remover and she could remove it herself. She was mad when I told her staple removal was a medical procedure and we could not give her one. Pt states she will have to leave and come back in a week. She was told she would have no wait when she came back for staple removal.

## 2017-11-24 NOTE — ED Triage Notes (Signed)
Here to have a staple removed from her scalp. It was done a week ago.

## 2017-11-27 DIAGNOSIS — K080 Exfoliation of teeth due to systemic causes: Secondary | ICD-10-CM | POA: Diagnosis not present

## 2018-05-25 ENCOUNTER — Ambulatory Visit: Payer: BLUE CROSS/BLUE SHIELD | Admitting: Obstetrics & Gynecology

## 2018-05-25 ENCOUNTER — Encounter: Payer: Self-pay | Admitting: Obstetrics & Gynecology

## 2018-05-25 ENCOUNTER — Other Ambulatory Visit: Payer: Self-pay

## 2018-05-25 ENCOUNTER — Other Ambulatory Visit (HOSPITAL_COMMUNITY)
Admission: RE | Admit: 2018-05-25 | Discharge: 2018-05-25 | Disposition: A | Discharge status: Home / Self Care | Payer: BLUE CROSS/BLUE SHIELD | Source: Ambulatory Visit | Attending: Obstetrics & Gynecology | Admitting: Obstetrics & Gynecology

## 2018-05-25 VITALS — BP 158/96 | HR 60 | Resp 16 | Ht 66.0 in | Wt 127.6 lb

## 2018-05-25 DIAGNOSIS — Z124 Encounter for screening for malignant neoplasm of cervix: Secondary | ICD-10-CM

## 2018-05-25 DIAGNOSIS — Z01419 Encounter for gynecological examination (general) (routine) without abnormal findings: Secondary | ICD-10-CM | POA: Diagnosis not present

## 2018-05-25 DIAGNOSIS — Z0000 Encounter for general adult medical examination without abnormal findings: Secondary | ICD-10-CM

## 2018-05-25 NOTE — Patient Instructions (Addendum)
Astroglide lubricant  Key E Vit E vaginal suppositories.  Can order from [employer].

## 2018-05-25 NOTE — Progress Notes (Signed)
52 y.o. G0P0000 MarriedCaucasianF here for annual exam.  Doing well.  Denies vaginal bleeding.  Had episode of syncope after giving blood in January.  Typically gives blood very regularly.  Hasn't given blood since then.  Blood pressure is elevated today.  Has never had elevated BPs.  Working on [employer] business over the next 30-45 days.  Stress level is very high.  Patient's last menstrual period was 10/03/2015.          Sexually active: Yes.    The current method of family planning is post menopausal status.    Exercising: Yes.    run, walk, bike Smoker:  no  Health Maintenance: Pap:  10/27/15 Neg. HR HPV:neg   10/23/14 neg  History of abnormal Pap:  no MMG:  10/18/16 BIRADS1:Neg Colonoscopy:  06/2015 polyps. F/u 5 years  BMD:   Never TDaP:  2010 Pneumonia vaccine(s):  n/a Shingrix:   D/w pt today Hep C testing: 10/27/15 Neg.  Donates blood.    Screening Labs: Here today   reports that she has never smoked. She has never used smokeless tobacco. She reports that she drinks about 6.0 - 8.4 oz of alcohol per week. She reports that she does not use drugs.  Past Medical History:  Diagnosis Date  . OAB (overactive bladder)   . PNA (pneumonia)     History reviewed. No pertinent surgical history.  No current outpatient medications on file.   No current facility-administered medications for this visit.     Family History  Problem Relation Age of Onset  . Breast cancer Sister 2133       lumpectomy-A&W  . Hypertension Mother   . Hypertension Father   . Heart attack Father 3844  . Hypertension Brother   . Hypertension Sister   . Breast cancer Sister 2363       bilateral mastectomy- 2017   . Hypertension Sister   . Hyperlipidemia Brother     Review of Systems  All other systems reviewed and are negative.   Exam:   BP (!) 158/96 (BP Location: Right Arm, Patient Position: Sitting, Cuff Size: Normal)   Pulse 60   Resp 16   Ht 5\' 6"  (1.676 m)   Wt 127 lb 9.6 oz (57.9 kg)    LMP 10/03/2015   BMI 20.60 kg/m    Height: 5\' 6"  (167.6 cm)  Ht Readings from Last 3 Encounters:  05/25/18 5\' 6"  (1.676 m)  11/24/17 5\' 6"  (1.676 m)  11/17/17 5\' 6"  (1.676 m)    General appearance: alert, cooperative and appears stated age Head: Normocephalic, without obvious abnormality, atraumatic Neck: no adenopathy, supple, symmetrical, trachea midline and thyroid normal to inspection and palpation Lungs: clear to auscultation bilaterally Breasts: normal appearance, no masses or tenderness Heart: regular rate and rhythm Abdomen: soft, non-tender; bowel sounds normal; no masses,  no organomegaly Extremities: extremities normal, atraumatic, no cyanosis or edema Skin: Skin color, texture, turgor normal. No rashes or lesions Lymph nodes: Cervical, supraclavicular, and axillary nodes normal. No abnormal inguinal nodes palpated Neurologic: Grossly normal  Pelvic: External genitalia:  no lesions              Urethra:  normal appearing urethra with no masses, tenderness or lesions              Bartholins and Skenes: normal                 Vagina: normal appearing vagina with normal color and discharge, no lesions  Cervix: no lesions              Pap taken: Yes.   Bimanual Exam:  Uterus:  normal size, contour, position, consistency, mobility, non-tender              Adnexa: normal adnexa and no mass, fullness, tenderness               Rectovaginal: Confirms               Anus:  normal sphincter tone, no lesions  Chaperone was present for exam.  A:  Well Woman with normal exam PMP, no HRT Social stressors Mild vasomotor symptoms Elevated BP today Elevated breast cancer risk via Tyrer Cusick model (>29%)  P:   Mammogram due.  Pt declines have my office schedule this for her.  Once this is done, will schedule MRI in six months.  Did this after last visit in 2017 but pt never went to MRI.   pap smear with HR HPV obtained today Pt is going to check AM blood pressures  over the next week and we will touch base.  If still elevated, is going to need treatment Lab work obtained today:  CBC, CMP, Lipids, TSH, Vit D Shingrix vaccination discussed today Consider BMD testing by age 87 return annually or prn

## 2018-05-26 LAB — LIPID PANEL
CHOL/HDL RATIO: 1.8 ratio (ref 0.0–4.4)
Cholesterol, Total: 205 mg/dL — ABNORMAL HIGH (ref 100–199)
HDL: 113 mg/dL (ref >39)
LDL CALC: 84 mg/dL (ref 0–99)
Triglycerides: 40 mg/dL (ref 0–149)
VLDL Cholesterol Cal: 8 mg/dL (ref 5–40)

## 2018-05-26 LAB — COMPREHENSIVE METABOLIC PANEL
ALT: 19 IU/L (ref 0–32)
AST: 18 IU/L (ref 0–40)
Albumin/Globulin Ratio: 1.8 (ref 1.2–2.2)
Albumin: 4.4 g/dL (ref 3.5–5.5)
Alkaline Phosphatase: 82 IU/L (ref 39–117)
BILIRUBIN TOTAL: 0.6 mg/dL (ref 0.0–1.2)
BUN/Creatinine Ratio: 22 (ref 9–23)
BUN: 13 mg/dL (ref 6–24)
CALCIUM: 9.7 mg/dL (ref 8.7–10.2)
CO2: 25 mmol/L (ref 20–29)
CREATININE: 0.6 mg/dL (ref 0.57–1.00)
Chloride: 104 mmol/L (ref 96–106)
GFR calc Af Amer: 120 mL/min/{1.73_m2} (ref >59)
GFR calc non Af Amer: 104 mL/min/{1.73_m2} (ref >59)
GLUCOSE: 92 mg/dL (ref 65–99)
Globulin, Total: 2.4 g/dL (ref 1.5–4.5)
Potassium: 4 mmol/L (ref 3.5–5.2)
Sodium: 139 mmol/L (ref 134–144)
TOTAL PROTEIN: 6.8 g/dL (ref 6.0–8.5)

## 2018-05-26 LAB — CBC
Hematocrit: 41.3 % (ref 34.0–46.6)
Hemoglobin: 13.2 g/dL (ref 11.1–15.9)
MCH: 31 pg (ref 26.6–33.0)
MCHC: 32 g/dL (ref 31.5–35.7)
MCV: 97 fL (ref 79–97)
Platelets: 260 10*3/uL (ref 150–450)
RBC: 4.26 x10E6/uL (ref 3.77–5.28)
RDW: 13.4 % (ref 12.3–15.4)
WBC: 5.9 10*3/uL (ref 3.4–10.8)

## 2018-05-26 LAB — TSH: TSH: 1.35 u[IU]/mL (ref 0.450–4.500)

## 2018-05-26 LAB — VITAMIN D 25 HYDROXY (VIT D DEFICIENCY, FRACTURES): Vit D, 25-Hydroxy: 26 ng/mL — ABNORMAL LOW (ref 30.0–100.0)

## 2018-05-28 ENCOUNTER — Telehealth: Payer: Self-pay | Admitting: *Deleted

## 2018-05-28 LAB — CYTOLOGY - PAP
DIAGNOSIS: NEGATIVE
HPV: NOT DETECTED

## 2018-05-28 NOTE — Telephone Encounter (Signed)
-----   Message from Jerene BearsMary S Miller, MD sent at 05/28/2018  7:28 AM EDT ----- Please let pt know her CBC, CMP, TSH were normal.  Cholesterol was normal except total was 205.  Vit D was just a little low at 26 but goal is to be above 30 so I don't think she needs to take anything else.  She was going to start taking her AM blood pressures.  Can you see if she did this over the weekend.  If still elevated, I will need to start her on treatment and refer for additional management.  If not elevated, I'd like her to keep checking and plan repeat BP in office in about two weeks.  Thanks.

## 2018-05-28 NOTE — Telephone Encounter (Signed)
LM for pt to call back.

## 2018-05-29 NOTE — Telephone Encounter (Signed)
Returning a call to Reina °

## 2018-05-29 NOTE — Telephone Encounter (Signed)
Pt notified. Verbalized understanding. Patient started checking BP yesterday and it was 116/79, today 121/81. Nurse visit for BP recheck scheduled for 7/30.

## 2018-06-08 ENCOUNTER — Other Ambulatory Visit: Payer: Self-pay | Admitting: Obstetrics & Gynecology

## 2018-06-08 DIAGNOSIS — Z1231 Encounter for screening mammogram for malignant neoplasm of breast: Secondary | ICD-10-CM

## 2018-06-12 ENCOUNTER — Ambulatory Visit (INDEPENDENT_AMBULATORY_CARE_PROVIDER_SITE_OTHER): Payer: BLUE CROSS/BLUE SHIELD

## 2018-06-12 VITALS — BP 136/84 | HR 62 | Ht 66.0 in | Wt 127.0 lb

## 2018-06-12 DIAGNOSIS — R030 Elevated blood-pressure reading, without diagnosis of hypertension: Secondary | ICD-10-CM | POA: Diagnosis not present

## 2018-06-12 NOTE — Progress Notes (Signed)
Patient is here for a blood pressure recheck.

## 2018-06-13 ENCOUNTER — Ambulatory Visit
Admission: RE | Admit: 2018-06-13 | Discharge: 2018-06-13 | Disposition: A | Discharge status: Home / Self Care | Payer: BLUE CROSS/BLUE SHIELD | Source: Ambulatory Visit | Attending: Obstetrics & Gynecology | Admitting: Obstetrics & Gynecology

## 2018-06-13 DIAGNOSIS — Z1231 Encounter for screening mammogram for malignant neoplasm of breast: Secondary | ICD-10-CM | POA: Diagnosis not present

## 2018-07-03 ENCOUNTER — Ambulatory Visit: Payer: BLUE CROSS/BLUE SHIELD

## 2018-09-18 ENCOUNTER — Ambulatory Visit (INDEPENDENT_AMBULATORY_CARE_PROVIDER_SITE_OTHER): Payer: BLUE CROSS/BLUE SHIELD | Admitting: Sports Medicine

## 2018-09-18 ENCOUNTER — Ambulatory Visit: Payer: Self-pay

## 2018-09-18 VITALS — BP 140/88 | Ht 66.0 in | Wt 125.0 lb

## 2018-09-18 DIAGNOSIS — S76302A Unspecified injury of muscle, fascia and tendon of the posterior muscle group at thigh level, left thigh, initial encounter: Secondary | ICD-10-CM

## 2018-09-18 DIAGNOSIS — S76312A Strain of muscle, fascia and tendon of the posterior muscle group at thigh level, left thigh, initial encounter: Secondary | ICD-10-CM

## 2018-09-18 MED ORDER — MELOXICAM 15 MG PO TABS: 15.0000 mg | ORAL_TABLET | Freq: Every day | ORAL | 1 refills | Status: DC

## 2018-09-18 MED ORDER — NITROGLYCERIN 0.2 MG/HR TD PT24: MEDICATED_PATCH | TRANSDERMAL | 1 refills | Status: DC

## 2018-09-18 NOTE — Progress Notes (Signed)
HPI  CC: left leg pain  Whitney Frederick is a 52 year old female who presents for left leg pain.  She states the pain started when she was running a half marathon last Saturday.  She states she was around 2 miles into the race when no of the runner cut her off.  She states that she felt a sharp pain in her left buttock at that time.  She did not feel any pop at the time.  She states she was able to carry on for another mile and a half.  She states at that point her leg felt weak and began to give out underneath her.  She then walked another 4 miles me with her husband, around mile 7.  She states that the pain was located around the mid substance of her left buttock as well as around the attachment site of her hamstrings.  She iced the area after the race.  She took ibuprofen 400 mg 3 times daily since Saturday.  She states this may be helped somewhat.  She states that sitting for long periods of time makes it worse.  She states that walking after sitting makes it worse.  She has no history of trauma to this area before.  She has numbness and tingling down her legs.  She has no weakness of her leg.  Past Injuries: Denies Past Surgeries: None Smoking: Denies Family Hx: Noncontributory  ROS: Per HPI; in addition no fever, no rash, no additional weakness, no additional numbness, no additional paresthesias, and no additional falls/injury.   All past medical history, allergies, medications reviewed by myself at today's visit.  Objective: BP 140/88   Ht 5\' 6"  (1.676 m)   Wt 125 lb (56.7 kg)   LMP 10/03/2015   BMI 20.18 kg/m  Gen: Right-Hand Dominant. NAD, well groomed, a/o x3, normal affect.  CV: Well-perfused. Warm.  Resp: Non-labored.  Neuro: Sensation intact throughout. No gross coordination deficits.  Gait: Nonpathologic posture, mild limp noted.  Left lower extremity exam: No erythema, warmth, swelling noted.  Tenderness palpation along the ischial tuberosity, tenderness palpation over the mid  substance of the gluteus muscle.  Full range of motion in hip flexion, hip extension, hip abduction, hip abduction.  Full range of motion of the knee in flexion extension.  Strength 5 out of 5 in knee flexion extension.  Strength 5 out of 5 in hamstring curls in all 3 directions.  Strength 4+ out of 5 in right side hip abductors.  Strength 5 out of 5 throughout the rest of hip testing.  Negative logroll, negativeFADIR and FABER testing.  Positive piriformis stretch testing.  Negative straight leg raise bilaterally.  Brief neuro exam: Patellar reflex 2+.  Achilles reflex 2+.  ULTRASOUND: right lower extremity Quick, limited diagnostic ultrasound obtained of patient's Hamstring and Piriformis.  -Hamstring insertion site at the ischial tuberosity observed, with hypoechoic changes at the attachment site.  Some surrounding fluid.  No tears or calcifications noted. -Piriformis muscle visualized in long axis.  No tears or calcifications noted.  Substantial fluid noted around the tendon sheath.  IMPRESSION: findings consistent with strain of the piriformis muscle.  Tendinitis at the attachment site of the hamstrings at the initial tuberosity.  Assessment and Plan: Piriformis strain  We discussed treatment options at today's visit.  She is locally tender over the piriformis muscle today, with symptoms made worse with piriformis stretch.  I provided her today with some stretches the piriformis muscle, as well as hip abductor and hip  stabilizer exercises.  I will start her meloxicam 50 mg for the next 7 days.  I have advised her to get some compression shorts wear of the area.  We will also start Nitropatch with one fourth of a patch over the affected area daily.  To continue to ice the area daily.  We will see her back for follow-up in 6 weeks.  If she is not improved, we can consider starting formal physical therapy.  Alric Quan, MD [facility] Sports Medicine Fellow 09/18/2018 9:20 AM  Patient seen and  evaluated with the sports medicine fellow.  I agree with the above plan of care.  Clinical exam and ultrasound suggest piriformis strain.  Treatment as above.  Follow-up again in 6 weeks or sooner if needed.

## 2018-09-18 NOTE — Patient Instructions (Signed)

## 2018-10-02 ENCOUNTER — Telehealth: Payer: Self-pay | Admitting: Sports Medicine

## 2018-10-02 NOTE — Telephone Encounter (Signed)
I spoke with the patient on the phone today after receiving pictures of what appears to be a partial distal hamstring tear.  Pictures show ecchymosis along the medial and lateral knee.  Patient does have some pain in this area.  I recommended that she get a compression sleeve and start Askling exercises.  Her piriformis pain is improving but she is beginning to get some adductor muscle pain and a feeling of the leg wanting to give way.  I would like to see her back in the office in 3 weeks for reevaluation.  She will ultimately need Nordic exercises for her hamstring.  I think she is okay to continue recreational walking and running using pain as her guide.

## 2018-10-09 ENCOUNTER — Ambulatory Visit: Payer: Self-pay

## 2018-10-09 ENCOUNTER — Encounter

## 2018-10-09 ENCOUNTER — Encounter: Payer: Self-pay | Admitting: Sports Medicine

## 2018-10-09 ENCOUNTER — Ambulatory Visit (INDEPENDENT_AMBULATORY_CARE_PROVIDER_SITE_OTHER): Payer: BLUE CROSS/BLUE SHIELD | Admitting: Sports Medicine

## 2018-10-09 VITALS — BP 110/86 | Ht 66.0 in | Wt 126.0 lb

## 2018-10-09 DIAGNOSIS — S76312D Strain of muscle, fascia and tendon of the posterior muscle group at thigh level, left thigh, subsequent encounter: Secondary | ICD-10-CM

## 2018-10-09 DIAGNOSIS — S76302D Unspecified injury of muscle, fascia and tendon of the posterior muscle group at thigh level, left thigh, subsequent encounter: Secondary | ICD-10-CM

## 2018-10-09 DIAGNOSIS — S76319A Strain of muscle, fascia and tendon of the posterior muscle group at thigh level, unspecified thigh, initial encounter: Secondary | ICD-10-CM | POA: Insufficient documentation

## 2018-10-09 NOTE — Assessment & Plan Note (Signed)
Askling exercise protocol HEP Add modified knee flexion exercises Easy hops x 5 - repeat 3 times Easy jogging  Compression sleeve  See in 3 weeks and reassess if ready to run

## 2018-10-09 NOTE — Progress Notes (Signed)
    CC: Left leg pain  HPI Whitney ShiverMaureen Frederick is a 52 year old female who presents for follow up of left leg pain. Her pain started in early November when running a half marathon. She was cut off by another runner and had to slow her pace quickly. Following this, she heard a pop in her left leg and had significant pain shooting down her left hamstring. She was unable to finish the race due to pain in her proximal hamstring. Over the next several days the pain migrated to her distal hamstring then back to her proximal hamstring. She was seen in clinic in early November and was thought to have piriformis syndrome at that time. She was given home exercises which she has been doing daily. She notes improvement in her hamstring pain, but has been having more medial leg pain. Several days after her last visit, she developed bruising of her distal hamstrings medially which has since resolved.  ROS She denies numbness or tingling in her legs. She denies weakness of her legs. She has been taking ibuprofen intermittently for the pain.  See HPI and/or previous note for associated ROS.  Objective: BP 110/86   Ht 5\' 6"  (1.676 m)   Wt 126 lb (57.2 kg)   LMP 10/03/2015   BMI 20.34 kg/m  Gen: NAD, well groomed, a/o x3, normal affect.  CV: Well-perfused. Warm.  Resp: Non-labored.  Neuro: Sensation intact throughout. No gross coordination deficits.  MSK Left leg Inspection: No bruising or deformity noted Palpation: Mild tenderness along ischial tuberosity on left side. Remainder of hamstring and adductor muscles non-tender to palpation ROM: Normal range of motion with flexion, extension, internal rotation, external rotation and abduction of the hip. Normal range of motion with flexion and extension at the knee Strength: 4/5 strength with hip flexion and abduction. 5/5 strength with hip adduction and extension. 4/5 strength with knee flexion. 5/5 strength with knee extension Isolated HS testing shows more  weakness with rotation medially suggesting semi-membranosus or ST injury FABER: Negative FADIR: Negative  Ultrasound: Left lower extremity -Limited diagnostic ultrasound performed of proximal hamstrings at ischial tuberosity -Avulsion fracture noted at semimembranosus attachment at ischial tuberosity -Semimembrinosis viewed in long and short axis. No tears distally noted -Moderate sized hematoma present extending to 5cm distal to the ischial tuberosity along the semimembrinosis  Impression: Findings consistent with partial tear and avulsion of the semimembrinosis. Hematoma noted spreading 5cm distally from the ischial tuberosity  Ultrasound and interpretation by Royal HawthornKarl B. Idil Maslanka, MD   Assessment and plan: Whitney Frederick is a 52 year old female who presents for follow up of left leg pain.   1. Left leg pain -Ultrasound findings consistent with partial tear of the semimembrinosis along with associated avulsion fracture and hematoma -Patient fitted for compression sleeve -Patient instructed to continue home exercises given at previous visit -Added hamstring curls to patients home exercise regiment -May continue NSAIDs as needed for pain -Patient may start very slow jogging or slow walking as tolerated -If patient continues to improve by next visit, will progress activity as tolerated  Follow up in 3 weeks with a repeat US at that time

## 2018-10-16 ENCOUNTER — Ambulatory Visit: Payer: BLUE CROSS/BLUE SHIELD | Admitting: Sports Medicine

## 2018-10-30 ENCOUNTER — Ambulatory Visit (INDEPENDENT_AMBULATORY_CARE_PROVIDER_SITE_OTHER): Payer: BLUE CROSS/BLUE SHIELD | Admitting: Sports Medicine

## 2018-10-30 VITALS — BP 122/81

## 2018-10-30 DIAGNOSIS — S76312D Strain of muscle, fascia and tendon of the posterior muscle group at thigh level, left thigh, subsequent encounter: Secondary | ICD-10-CM | POA: Diagnosis not present

## 2018-10-30 NOTE — Progress Notes (Signed)
   HPI Patient runs half marathons.  She is here for follow-up from a left hamstring tear that happened after an abrupt stop while running 1 day.  She has been following all of her exercises as prescribed.  She has been largely avoiding running as prescribed.  She has been using Aleve intermittently for pain.  She says that she had been largely improving although roughly 1 week ago she had a 4-day stretch of significant pain that interfered with her sleep.  That has since resolved.  She denies any significant distal neurological or vascular problems.  CC: Left hamstring tear  Medications/Interventions Tried: Aleve, rest, strengthening for sizes  See HPI and/or previous note for associated ROS.  Objective: BP 122/81   LMP 10/03/2015  Gen: Athletic, NAD, well groomed, a/o x3, normal affect.  CV: Well-perfused. Warm.  Resp: Non-labored.  Neuro: Sensation intact throughout. No gross coordination deficits.  Gait: Nonpathologic posture, unremarkable stride without signs of limp or balance issues. Left leg exam: Patient exhibits significantly improved hip abductor strength bilaterally.  She has good concentric and static strength and hamstring with foot in neutral-internally rotated-externally rotated positions.  She has good extension strength at the hip.  She does experience a mild cramping with flexion of the hamstring, and a feeling that something "is not quite right "but denies significant pain with this.  She is able to stride up and down the hall with proper form.  She is able to hop on one leg.   Assessment and plan:  Partial hamstring tear Patient with improving strength and function of the left hamstring.  She is able to stride with good form and can hop on one leg.  Patient to continue strengthening exercises she was given before.  She will stop sliding lunges.  And will use reverse lunge instead.  She is able to start running 3 days a week, when she can do that for 45 minutes at a time  without any pain and while maintaining her form, she can begin to go to every other day.  The next step after that would be 2 days in a row with 1 day off.  She may continue use Aleve for pain.  Follow-up in 4 weeks at which point we will likely reimage the hamstring with ultrasound   No orders of the defined types were placed in this encounter.   No orders of the defined types were placed in this encounter.    Marthenia RollingScott Armarion Greek, DO PGY2 Resident 10/30/2018 10:48 PM  I observed and examined the patient with the resident and agree with assessment and plan.  Note reviewed and modified by me. Sterling BigKB Fields, MD

## 2018-10-30 NOTE — Assessment & Plan Note (Addendum)
Patient with improving strength and function of the left hamstring.  She is able to stride with good form and can hop on one leg.  Patient to continue strengthening exercises she was given before.  She will stop sliding lunges.  And will use reverse lunge instead.  She is able to start running 3 days a week, when she can do that for 45 minutes at a time without any pain and while maintaining her form, she can begin to go to every other day.  The next step after that would be 2 days in a row with 1 day off.  She may continue use Aleve for pain.  Follow-up in 4 weeks at which point we will likely reimage the hamstring with ultrasound

## 2018-11-29 ENCOUNTER — Ambulatory Visit: Payer: BLUE CROSS/BLUE SHIELD | Admitting: Sports Medicine

## 2018-12-04 ENCOUNTER — Telehealth: Payer: Self-pay | Admitting: Emergency Medicine

## 2018-12-04 DIAGNOSIS — Z803 Family history of malignant neoplasm of breast: Secondary | ICD-10-CM

## 2018-12-04 DIAGNOSIS — Z1239 Encounter for other screening for malignant neoplasm of breast: Secondary | ICD-10-CM

## 2018-12-04 DIAGNOSIS — Z9189 Other specified personal risk factors, not elsewhere classified: Secondary | ICD-10-CM

## 2018-12-04 DIAGNOSIS — Z1231 Encounter for screening mammogram for malignant neoplasm of breast: Secondary | ICD-10-CM

## 2018-12-04 NOTE — Telephone Encounter (Signed)
-----   Message from Jerene BearsMary S Miller, MD sent at 12/01/2018  7:25 AM EST ----- Regarding: breast MRI Pt has >29% lifetime risk of breast cancer.  Needs MRI scheduled.  Can you contact pt and make sure she is ready to proceed.  Last MMG was 7/19.  Thanks.  MSM

## 2018-12-13 NOTE — Telephone Encounter (Signed)
Order placed for MRI.  Mychart message to patient.  Mammogram 05/2018.   Left detailed message on voicemail.  Okay per designated party release form.

## 2018-12-20 ENCOUNTER — Encounter: Payer: Self-pay | Admitting: Sports Medicine

## 2018-12-20 ENCOUNTER — Ambulatory Visit (INDEPENDENT_AMBULATORY_CARE_PROVIDER_SITE_OTHER): Payer: BLUE CROSS/BLUE SHIELD | Admitting: Sports Medicine

## 2018-12-20 VITALS — BP 127/68 | Ht 66.0 in | Wt 126.0 lb

## 2018-12-20 DIAGNOSIS — S76312D Strain of muscle, fascia and tendon of the posterior muscle group at thigh level, left thigh, subsequent encounter: Secondary | ICD-10-CM | POA: Diagnosis not present

## 2018-12-20 NOTE — Progress Notes (Signed)
  Whitney Frederick - 53 y.o. female MRN [id number]  Date of birth: 1966/05/16    SUBJECTIVE:      Chief Complaint: Left hamstring pain  HPI:  53 year old female here for follow-up for left proximal hamstring injury.  She reports being 90% improved.  She has been doing stretching exercises and using nitroglycerin.  She has also returned to running and has been able to run 7 miles with no pain.  Her pain at this point is primarily with prolonged sitting.  She denies any new injuries to her hamstring.  No numbness or tingling.   ROS:     See HPI  PERTINENT  PMH / PSH FH / / SH:  Past Medical, Surgical, Social, and Family History Reviewed & Updated in the EMR.    OBJECTIVE: BP 127/68   Ht 5\' 6"  (1.676 m)   Wt 126 lb (57.2 kg)   LMP 10/03/2015   BMI 20.34 kg/m   Physical Exam:  Vital signs are reviewed.  GEN: Alert and oriented, NAD Pulm: Breathing unlabored PSY: normal mood, congruent affect  MSK: Left hip:  - Inspection: No gross deformity, no swelling, erythema, or ecchymosis - Palpation: Slight tenderness over the ischial tuberosity - ROM: Normal range of motion on Flexion, extension, abduction, internal and external rotation - Strength: 5/5 strength with hip abduction and resisted knee flexion without pain - Neuro/vasc: NV intact distally  Right hip: 5/5 strength with hip abduction and resisted knee flexion   MSK [place]: Limited ultrasound of the posterior left hip and proximal hamstrings insertion.  At the insertion of the semimembranosus/tendinosis there appears to be resolution of the calcific changes within the tendon.  There is no hypoechoic change suggestive of edema.  There is still slight some edema more distally at the musculotendinous junction but this is improved.  Also this area there is approximately a 0.5 x 1 cm area of calcification.  This does correspond with her area of tenderness   ASSESSMENT & PLAN:  1.  Left proximal hamstring strain-at this time she is  doing significantly better in terms of symptoms and pain with activity. - May continue advance running as tolerated - She will add hops, overstretching, and stride-outs to her water exercises - Continue to be cautious with downhill running - Follow-up as needed  I observed and examined the patient with the [facility] Fellow and agree with assessment and plan.  Note reviewed and modified by me. Sterling Big, MD

## 2019-01-09 ENCOUNTER — Telehealth: Payer: Self-pay | Admitting: Obstetrics & Gynecology

## 2019-01-09 NOTE — Telephone Encounter (Signed)
Call placed to patient to follow up in regards to scheduling recommended breast MRI. Spoke with Drenda Freeze with [facility], they have left two phone message requesting a return call to schedule. I have left a voicemail message requesting a return call. Upon return call, will encourage patient to contact [facility] at [phone], to schedule recommended breast MRI/   cc: Dr Hyacinth Meeker  cc: Almedia Balls, RN

## 2019-01-28 NOTE — Telephone Encounter (Signed)
Dr. Hyacinth Meeker,  Please advise for follow up of High Risk Breast MRI.  I have left the patient a detailed message on her phone.  I have sent her a mychart message that she has read and [facility] has attempted to call her twice and left messages.

## 2019-02-01 NOTE — Telephone Encounter (Signed)
Ok to stop calling.  She is obviously aware of this and we discussed at OV as well.

## 2019-02-19 DIAGNOSIS — H11442 Conjunctival cysts, left eye: Secondary | ICD-10-CM | POA: Diagnosis not present

## 2019-05-30 ENCOUNTER — Other Ambulatory Visit: Payer: Self-pay

## 2019-06-03 ENCOUNTER — Other Ambulatory Visit: Payer: Self-pay

## 2019-06-03 ENCOUNTER — Encounter: Payer: Self-pay | Admitting: Obstetrics & Gynecology

## 2019-06-03 ENCOUNTER — Ambulatory Visit (INDEPENDENT_AMBULATORY_CARE_PROVIDER_SITE_OTHER): Payer: BC Managed Care – PPO | Admitting: Obstetrics & Gynecology

## 2019-06-03 VITALS — BP 128/86 | HR 56 | Temp 97.9°F | Ht 65.5 in | Wt 127.0 lb

## 2019-06-03 DIAGNOSIS — Z23 Encounter for immunization: Secondary | ICD-10-CM

## 2019-06-03 DIAGNOSIS — Z01419 Encounter for gynecological examination (general) (routine) without abnormal findings: Secondary | ICD-10-CM

## 2019-06-03 NOTE — Progress Notes (Signed)
53 y.o. G0P0000 Married White or Caucasian female here for annual exam.  Denies vaginal bleeding.  She never called back about having her breast MRI in January.  Would like to proceed with this   Patient's last menstrual period was 10/03/2015.          Sexually active: Yes.    The current method of family planning is post menopausal status.    Exercising: Yes.    run, bike Smoker:  no  Health Maintenance: Pap:  05/25/18 neg. HR HPV:neg  10/27/15 neg. HR HPV:neg  History of abnormal Pap:  no MMG:  06/13/18 BIRADS1:neg.  Pt will schedule.   Colonoscopy:  06/2015 Polyps. F/u 5 years BMD:   Never TDaP:  07/16/09 Pneumonia vaccine(s):  Done  Shingrix:   No Hep C testing: 10/27/15 Neg  Screening Labs: done 2019   reports that she has never smoked. She has never used smokeless tobacco. She reports current alcohol use of about 10.0 standard drinks of alcohol per week. She reports that she does not use drugs.  Past Medical History:  Diagnosis Date  . OAB (overactive bladder)   . PNA (pneumonia)     History reviewed. No pertinent surgical history.  Current Outpatient Medications  Medication Sig Dispense Refill  . nitroGLYCERIN (NITRODUR - DOSED IN MG/24 HR) 0.2 mg/hr patch Use 1/4 patch daily to the affected area. (Patient not taking: Reported on 06/03/2019) 30 patch 1   No current facility-administered medications for this visit.     Family History  Problem Relation Age of Onset  . Breast cancer Sister [id number]       lumpectomy-A&W  . Hypertension Mother   . Hypertension Father   . Heart attack Father [phone]  . Hypertension Brother   . Hypertension Sister   . Breast cancer Sister [id number]       bilateral mastectomy- 2017   . Hypertension Sister   . Hyperlipidemia Brother     Review of Systems  All other systems reviewed and are negative.   Exam:   BP 128/86   Pulse (!) 56   Temp 97.9 F (36.6 C) (Temporal)   Ht 5' 5.5" (1.664 m)   Wt 127 lb (57.6 kg)   LMP 10/03/2015   BMI 20.81  kg/m    Height: 5' 5.5" (166.4 cm)  Ht Readings from Last 3 Encounters:  06/03/19 5' 5.5" (1.664 m)  12/20/18 5\' 6"  (1.676 m)  10/09/18 5\' 6"  (1.676 m)    General appearance: alert, cooperative and appears stated age Head: Normocephalic, without obvious abnormality, atraumatic Neck: no adenopathy, supple, symmetrical, trachea midline and thyroid normal to inspection and palpation Lungs: clear to auscultation bilaterally Breasts: normal appearance, no masses or tenderness Heart: regular rate and rhythm Abdomen: soft, non-tender; bowel sounds normal; no masses,  no organomegaly Extremities: extremities normal, atraumatic, no cyanosis or edema Skin: Skin color, texture, turgor normal. No rashes or lesions Lymph nodes: Cervical, supraclavicular, and axillary nodes normal. No abnormal inguinal nodes palpated Neurologic: Grossly normal   Pelvic: External genitalia:  no lesions              Urethra:  normal appearing urethra with no masses, tenderness or lesions              Bartholins and Skenes: normal                 Vagina: normal appearing vagina with normal color and discharge, no lesions  Cervix: no lesions              Pap taken: No. Bimanual Exam:  Uterus:  normal size, contour, position, consistency, mobility, non-tender              Adnexa: normal adnexa and no mass, fullness, tenderness               Rectovaginal: Confirms               Anus:  normal sphincter tone, no lesions  Chaperone was present for exam.  A:  Well Woman with normal exam PMP, no HRT Elevated lifetime breast cancer risk with Tyrer Cusick model >29% Colon polyps  P:   Mammogram guidelines reviewed.  Will plan MRI in six month.  pap smear with neg HR HPV 2019.  Not indicated today. Tdap updated Colonoscopy due next year Consider BMD around age 10 Shingrix vaccination discussed today Return annually or prn

## 2019-06-14 ENCOUNTER — Other Ambulatory Visit: Payer: Self-pay | Admitting: Obstetrics & Gynecology

## 2019-06-14 DIAGNOSIS — Z1231 Encounter for screening mammogram for malignant neoplasm of breast: Secondary | ICD-10-CM

## 2019-08-01 ENCOUNTER — Other Ambulatory Visit: Payer: Self-pay

## 2019-08-01 ENCOUNTER — Ambulatory Visit
Admission: RE | Admit: 2019-08-01 | Discharge: 2019-08-01 | Disposition: A | Discharge status: Home / Self Care | Payer: BC Managed Care – PPO | Source: Ambulatory Visit | Attending: Obstetrics & Gynecology | Admitting: Obstetrics & Gynecology

## 2019-08-01 DIAGNOSIS — Z1231 Encounter for screening mammogram for malignant neoplasm of breast: Secondary | ICD-10-CM

## 2019-09-03 ENCOUNTER — Other Ambulatory Visit: Payer: Self-pay | Admitting: Sports Medicine

## 2020-02-04 ENCOUNTER — Telehealth: Payer: Self-pay | Admitting: *Deleted

## 2020-02-04 NOTE — Telephone Encounter (Signed)
Call to patient per Dr. Hyacinth Meeker request.  Bilateral breast MRI w/wo contrast ordered 12/13/18, not scheduled to date. Increased lifetime risk of breast cancer.   Spoke with patient. Patient would like to proceed with breast MRI as recommended. Patient would like to check with her insurance plan prior to placing order to confirm preferred location for imaging. Patient will return call to office to advise where she would like order placed. Patient is aware to call if any assistance needed.

## 2020-03-13 NOTE — Telephone Encounter (Signed)
Left message to call Brysun Eschmann, RN at [facility] [phone].   

## 2020-03-25 NOTE — Telephone Encounter (Signed)
Ok to close encounter. Thank you.

## 2020-03-25 NOTE — Telephone Encounter (Signed)
Encounter closed

## 2020-03-25 NOTE — Telephone Encounter (Signed)
Patient has not returned call to advise on Screening Breast MRI. Patient aware of recommendations. No order placed.   Dr. Hyacinth Meeker -please advise.

## 2020-08-11 NOTE — Progress Notes (Signed)
54 y.o. G0P0000 Married White or Caucasian female here for annual exam.  Doing well.  Denies vaginal bleeding.     Patient's last menstrual period was 10/03/2015.          Sexually active: Yes.    The current method of family planning is post menopausal status.    Exercising: Yes.    run daily Smoker:  no  Health Maintenance: Pap:  05-25-18 neg HPV HR neg History of abnormal Pap:  no MMG:  08-02-2019 category d density birads 1:neg.  Aware this is due.   Colonoscopy:  8/16  Polyps f/u 5 yrs.  Aware this due.   BMD:   none TDaP:  2020 Pneumonia vaccine(s):  Done 2016 Shingrix:   D/w pt today Hep C testing: neg 2016 Screening Labs: obtained today   reports that she has never smoked. She has never used smokeless tobacco. She reports current alcohol use of about 10.0 standard drinks of alcohol per week. She reports that she does not use drugs.  Past Medical History:  Diagnosis Date  . OAB (overactive bladder)   . PNA (pneumonia)     History reviewed. No pertinent surgical history.  No current outpatient medications on file.   No current facility-administered medications for this visit.    Family History  Problem Relation Age of Onset  . Breast cancer Sister 58       lumpectomy-A&W  . Hypertension Mother   . Hypertension Father   . Heart attack Father 56  . Hypertension Brother   . Hypertension Sister   . Breast cancer Sister 102       bilateral mastectomy- 2017   . Hypertension Sister   . Hyperlipidemia Brother     Review of Systems  Constitutional: Negative.   HENT: Negative.   Eyes: Negative.   Respiratory: Negative.   Cardiovascular: Negative.   Gastrointestinal: Negative.   Endocrine: Negative.   Genitourinary: Negative.   Musculoskeletal: Negative.   Skin: Negative.   Allergic/Immunologic: Negative.   Neurological: Negative.   Hematological: Negative.   Psychiatric/Behavioral: Negative.     Exam:   BP 114/70   Pulse 68   Resp 16   Ht 5' 5.75" (1.67  m)   Wt 123 lb (55.8 kg)   LMP 10/03/2015   BMI 20.00 kg/m   Height: 5' 5.75" (167 cm)  General appearance: alert, cooperative and appears stated age Head: Normocephalic, without obvious abnormality, atraumatic Neck: no adenopathy, supple, symmetrical, trachea midline and thyroid normal to inspection and palpation Lungs: clear to auscultation bilaterally Breasts: normal appearance, no masses or tenderness Heart: regular rate and rhythm Abdomen: soft, non-tender; bowel sounds normal; no masses,  no organomegaly Extremities: extremities normal, atraumatic, no cyanosis or edema Skin: Skin color, texture, turgor normal. No rashes or lesions Lymph nodes: Cervical, supraclavicular, and axillary nodes normal. No abnormal inguinal nodes palpated Neurologic: Grossly normal   Pelvic: External genitalia:  no lesions              Urethra:  normal appearing urethra with no masses, tenderness or lesions              Bartholins and Skenes: normal                 Vagina: normal appearing vagina with normal color and discharge, no lesions              Cervix: no lesions              Pap  taken: No. Bimanual Exam:  Uterus:  normal size, contour, position, consistency, mobility, non-tender              Adnexa: normal adnexa and no mass, fullness, tenderness               Rectovaginal: Confirms               Anus:  normal sphincter tone, no lesions  Chaperone, Zenovia Jordan, CMA, was present for exam.  A:  Well Woman with normal exam PMP, no HRT Elevated lifetime of breast cancer risk with Tyrer Cusick model >29% Colon polyps  P:   Mammogram guidelines reviewed.  We have discussed breast MRI in the past. pap smear neg with neg HR HPV 2019 Colonoscopy is due.  Pt aware. Plan BMD around age 58 Shingrix vaccination discussed today Return annually or prn

## 2020-08-13 ENCOUNTER — Other Ambulatory Visit: Payer: Self-pay

## 2020-08-13 ENCOUNTER — Encounter: Payer: Self-pay | Admitting: Obstetrics & Gynecology

## 2020-08-13 ENCOUNTER — Ambulatory Visit (INDEPENDENT_AMBULATORY_CARE_PROVIDER_SITE_OTHER): Payer: BC Managed Care – PPO | Admitting: Obstetrics & Gynecology

## 2020-08-13 VITALS — BP 114/70 | HR 68 | Resp 16 | Ht 65.75 in | Wt 123.0 lb

## 2020-08-13 DIAGNOSIS — Z01419 Encounter for gynecological examination (general) (routine) without abnormal findings: Secondary | ICD-10-CM

## 2020-08-13 DIAGNOSIS — Z0000 Encounter for general adult medical examination without abnormal findings: Secondary | ICD-10-CM

## 2020-08-14 LAB — COMPREHENSIVE METABOLIC PANEL
ALT: 21 IU/L (ref 0–32)
AST: 30 IU/L (ref 0–40)
Albumin/Globulin Ratio: 1.6 (ref 1.2–2.2)
Albumin: 4.4 g/dL (ref 3.8–4.9)
Alkaline Phosphatase: 113 IU/L (ref 44–121)
BUN/Creatinine Ratio: 29 — ABNORMAL HIGH (ref 9–23)
BUN: 17 mg/dL (ref 6–24)
Bilirubin Total: 0.4 mg/dL (ref 0.0–1.2)
CO2: 25 mmol/L (ref 20–29)
Calcium: 9.3 mg/dL (ref 8.7–10.2)
Chloride: 103 mmol/L (ref 96–106)
Creatinine, Ser: 0.59 mg/dL (ref 0.57–1.00)
GFR calc Af Amer: 118 mL/min/{1.73_m2} (ref >59)
GFR calc non Af Amer: 103 mL/min/{1.73_m2} (ref >59)
Globulin, Total: 2.7 g/dL (ref 1.5–4.5)
Glucose: 93 mg/dL (ref 65–99)
Potassium: 4.2 mmol/L (ref 3.5–5.2)
Sodium: 141 mmol/L (ref 134–144)
Total Protein: 7.1 g/dL (ref 6.0–8.5)

## 2020-08-14 LAB — LIPID PANEL
Chol/HDL Ratio: 1.8 ratio (ref 0.0–4.4)
Cholesterol, Total: 203 mg/dL — ABNORMAL HIGH (ref 100–199)
HDL: 115 mg/dL (ref >39)
LDL Chol Calc (NIH): 80 mg/dL (ref 0–99)
Triglycerides: 42 mg/dL (ref 0–149)
VLDL Cholesterol Cal: 8 mg/dL (ref 5–40)

## 2020-08-14 LAB — CBC
Hematocrit: 39.1 % (ref 34.0–46.6)
Hemoglobin: 12.6 g/dL (ref 11.1–15.9)
MCH: 29.4 pg (ref 26.6–33.0)
MCHC: 32.2 g/dL (ref 31.5–35.7)
MCV: 91 fL (ref 79–97)
Platelets: 274 10*3/uL (ref 150–450)
RBC: 4.28 x10E6/uL (ref 3.77–5.28)
RDW: 12.9 % (ref 11.7–15.4)
WBC: 6.7 10*3/uL (ref 3.4–10.8)

## 2020-08-14 LAB — TSH: TSH: 1.18 u[IU]/mL (ref 0.450–4.500)

## 2021-06-21 ENCOUNTER — Other Ambulatory Visit: Payer: Self-pay | Admitting: Obstetrics & Gynecology

## 2021-06-21 DIAGNOSIS — Z1231 Encounter for screening mammogram for malignant neoplasm of breast: Secondary | ICD-10-CM

## 2021-06-23 ENCOUNTER — Other Ambulatory Visit: Payer: Self-pay

## 2021-06-23 ENCOUNTER — Ambulatory Visit
Admission: RE | Admit: 2021-06-23 | Discharge: 2021-06-23 | Disposition: A | Discharge status: Home / Self Care | Payer: 59 | Source: Ambulatory Visit | Attending: Obstetrics & Gynecology | Admitting: Obstetrics & Gynecology

## 2021-06-23 DIAGNOSIS — Z1231 Encounter for screening mammogram for malignant neoplasm of breast: Secondary | ICD-10-CM

## 2021-08-04 ENCOUNTER — Encounter (HOSPITAL_BASED_OUTPATIENT_CLINIC_OR_DEPARTMENT_OTHER): Payer: Self-pay | Admitting: Obstetrics & Gynecology

## 2021-08-04 ENCOUNTER — Other Ambulatory Visit (HOSPITAL_COMMUNITY)
Admission: RE | Admit: 2021-08-04 | Discharge: 2021-08-04 | Disposition: A | Discharge status: Home / Self Care | Payer: 59 | Source: Ambulatory Visit | Attending: Obstetrics & Gynecology | Admitting: Obstetrics & Gynecology

## 2021-08-04 ENCOUNTER — Other Ambulatory Visit: Payer: Self-pay

## 2021-08-04 ENCOUNTER — Ambulatory Visit (INDEPENDENT_AMBULATORY_CARE_PROVIDER_SITE_OTHER): Payer: 59 | Admitting: Obstetrics & Gynecology

## 2021-08-04 VITALS — BP 154/98 | HR 47 | Ht 66.0 in | Wt 127.4 lb

## 2021-08-04 DIAGNOSIS — Z124 Encounter for screening for malignant neoplasm of cervix: Secondary | ICD-10-CM | POA: Diagnosis not present

## 2021-08-04 DIAGNOSIS — Z780 Asymptomatic menopausal state: Secondary | ICD-10-CM

## 2021-08-04 DIAGNOSIS — B009 Herpesviral infection, unspecified: Secondary | ICD-10-CM

## 2021-08-04 DIAGNOSIS — R030 Elevated blood-pressure reading, without diagnosis of hypertension: Secondary | ICD-10-CM

## 2021-08-04 DIAGNOSIS — Z9189 Other specified personal risk factors, not elsewhere classified: Secondary | ICD-10-CM

## 2021-08-04 DIAGNOSIS — Z01419 Encounter for gynecological examination (general) (routine) without abnormal findings: Secondary | ICD-10-CM

## 2021-08-04 DIAGNOSIS — K069 Disorder of gingiva and edentulous alveolar ridge, unspecified: Secondary | ICD-10-CM

## 2021-08-04 DIAGNOSIS — Z803 Family history of malignant neoplasm of breast: Secondary | ICD-10-CM | POA: Diagnosis not present

## 2021-08-04 MED ORDER — SULFAMETHOXAZOLE-TRIMETHOPRIM 800-160 MG PO TABS: 1.0000 | ORAL_TABLET | Freq: Two times a day (BID) | ORAL | 0 refills | Status: DC

## 2021-08-04 MED ORDER — MUPIROCIN 2 % EX OINT: 1.0000 "application " | TOPICAL_OINTMENT | Freq: Two times a day (BID) | CUTANEOUS | 0 refills | Status: AC

## 2021-08-04 MED ORDER — VALACYCLOVIR HCL 1 G PO TABS: ORAL_TABLET | ORAL | 1 refills | Status: DC

## 2021-08-04 NOTE — Progress Notes (Addendum)
55 y.o. G0P0000 Married White or Caucasian female here for annual exam.  Doing well.  Denies vaginal bleeding.    Has earlobe infection that came from changing earrings.   Patient's last menstrual period was 10/03/2015.          Sexually active: Yes.    The current method of family planning is post menopausal status.    Exercising: Yes.     Runs daily Smoker:  no  Health Maintenance: Pap:  05/25/2018 Negative History of abnormal Pap:  pt reports remote hx in her early 20's MMG:  06/23/2021 Negative Colonoscopy:  2016, follow up 5 years due to polyps BMD:   not indicated yet TDaP:  2020 Shingrix:   discussed with pt Hep C testing: 2016 Screening Labs: 07/2020   reports that she has never smoked. She has never used smokeless tobacco. She reports current alcohol use of about 10.0 standard drinks per week. She reports that she does not use drugs.  Past Medical History:  Diagnosis Date   OAB (overactive bladder)    PNA (pneumonia)     No past surgical history on file.  No current outpatient medications on file.   No current facility-administered medications for this visit.    Family History  Problem Relation Age of Onset   Breast cancer Sister 55       lumpectomy-A&W   Hypertension Mother    Hypertension Father    Heart attack Father 92   Hypertension Brother    Hypertension Sister    Breast cancer Sister 66       bilateral mastectomy- 2017    Hypertension Sister    Hyperlipidemia Brother     Review of Systems  Exam:   BP (!) 154/98 (BP Location: Left Arm, Patient Position: Sitting, Cuff Size: Small)   Pulse (!) 47   Ht 5\' 6"  (1.676 m) Comment: reported  Wt 127 lb 6.4 oz (57.8 kg)   LMP 10/03/2015   BMI 20.56 kg/m   Height: 5\' 6"  (167.6 cm) (reported)  General appearance: alert, cooperative and appears stated age Head: Normocephalic, without obvious abnormality, atraumatic Neck: no adenopathy, supple, symmetrical, trachea midline and thyroid normal to  inspection and palpation Lungs: clear to auscultation bilaterally Breasts: normal appearance, no masses or tenderness Heart: regular rate and rhythm Abdomen: soft, non-tender; bowel sounds normal; no masses,  no organomegaly Extremities: extremities normal, atraumatic, no cyanosis or edema Skin: Skin color, texture, turgor normal. No rashes or lesions Lymph nodes: Cervical, supraclavicular, and axillary nodes normal. No abnormal inguinal nodes palpated Neurologic: Grossly normal   Pelvic: External genitalia:  no lesions              Urethra:  normal appearing urethra with no masses, tenderness or lesions              Bartholins and Skenes: normal                 Vagina: normal appearing vagina with normal color and no discharge, no lesions              Cervix: no lesions              Pap taken: Yes.   Bimanual Exam:  Uterus:  normal size, contour, position, consistency, mobility, non-tender              Adnexa: normal adnexa and no mass, fullness, tenderness               Rectovaginal: Confirms  Anus:  normal sphincter tone, no lesions  Chaperone, Ina Homes, CMA, was present for exam.  Assessment/Plan: 1. Well woman exam with routine gynecological exam - Pap smear with HR HPV obtained today - Mammogram 06/2021 - Colonoscopy 2016.  Was due last year.  Pt is aware.  She has already called [facility] - Bone mineral density will be planned around age 24 - lab work done done last year.  Reviewed will pt. - vaccines reviewed/updated  2. Postmenopausal - no HRT  3. Family history of breast cancer - have discussed breast MRI with her.  Declines for now.  She has declined in the past as well.  Changing insurances.  4.  Increased risk of breast cancer  5. Elevated blood pressure reading - pt is going to monitor this and let me know readings in 2 weeks  6.  Earlobe infection - bactroban ointment and bactrim prescribed  7.  Oral fever blisters - valtrex 1gram, 2 tabs  x 1, repeat in 12 hours

## 2021-08-04 NOTE — Addendum Note (Signed)
Addended by: Jerene Bears on: 08/04/2021 04:47 PM   Modules accepted: Orders

## 2021-08-06 LAB — CYTOLOGY - PAP
Comment: NEGATIVE
Diagnosis: NEGATIVE
High risk HPV: NEGATIVE

## 2022-02-03 DIAGNOSIS — K649 Unspecified hemorrhoids: Secondary | ICD-10-CM | POA: Diagnosis not present

## 2022-02-03 DIAGNOSIS — K5730 Diverticulosis of large intestine without perforation or abscess without bleeding: Secondary | ICD-10-CM | POA: Diagnosis not present

## 2022-02-03 DIAGNOSIS — Z86010 Personal history of colonic polyps: Secondary | ICD-10-CM | POA: Diagnosis not present

## 2022-02-03 DIAGNOSIS — D125 Benign neoplasm of sigmoid colon: Secondary | ICD-10-CM | POA: Diagnosis not present

## 2022-02-03 DIAGNOSIS — Z09 Encounter for follow-up examination after completed treatment for conditions other than malignant neoplasm: Secondary | ICD-10-CM | POA: Diagnosis not present

## 2022-03-18 DIAGNOSIS — F329 Major depressive disorder, single episode, unspecified: Secondary | ICD-10-CM | POA: Diagnosis not present

## 2022-03-24 ENCOUNTER — Encounter (HOSPITAL_BASED_OUTPATIENT_CLINIC_OR_DEPARTMENT_OTHER): Payer: Self-pay | Admitting: *Deleted

## 2022-04-04 DIAGNOSIS — F329 Major depressive disorder, single episode, unspecified: Secondary | ICD-10-CM | POA: Diagnosis not present

## 2022-05-02 DIAGNOSIS — F329 Major depressive disorder, single episode, unspecified: Secondary | ICD-10-CM | POA: Diagnosis not present

## 2022-05-23 DIAGNOSIS — F329 Major depressive disorder, single episode, unspecified: Secondary | ICD-10-CM | POA: Diagnosis not present

## 2022-06-01 ENCOUNTER — Ambulatory Visit
Admission: EM | Admit: 2022-06-01 | Discharge: 2022-06-01 | Disposition: A | Discharge status: Home / Self Care | Payer: BC Managed Care – PPO | Attending: Emergency Medicine | Admitting: Emergency Medicine

## 2022-06-01 DIAGNOSIS — L255 Unspecified contact dermatitis due to plants, except food: Secondary | ICD-10-CM | POA: Diagnosis not present

## 2022-06-01 MED ORDER — FLUCONAZOLE 150 MG PO TABS: ORAL_TABLET | ORAL | 0 refills | Status: DC

## 2022-06-01 MED ORDER — PREDNISONE 10 MG PO TABS: ORAL_TABLET | ORAL | 0 refills | Status: AC

## 2022-06-01 MED ORDER — DEXAMETHASONE SODIUM PHOSPHATE 10 MG/ML IJ SOLN
10.0000 mg | Freq: Once | INTRAMUSCULAR | Status: AC
  Administered 2022-06-01: 10 mg via INTRAMUSCULAR

## 2022-06-01 NOTE — ED Provider Notes (Signed)
[facility]    CSN: [id number] Arrival date & time: 06/01/22  1754    HISTORY   Chief Complaint  Patient presents with   Rash   HPI Whitney Frederick is a pleasant, 56 y.o. female who presents to urgent care today. Patient complains of rash in genital area and on left hip after squatting in the woods to pee while out running.  Patient states she believes she is coming to contact with poison ivy.  Area is red, swollen and very itchy and has some fluid-filled blisters scattered across the areas of redness.  The history is provided by the patient.   Past Medical History:  Diagnosis Date   Achilles tendon tear    OAB (overactive bladder)    PNA (pneumonia)    Patient Active Problem List   Diagnosis Date Noted   Postmenopausal 08/04/2021   Family history of breast cancer 08/04/2021   Increased risk of breast cancer 08/04/2021   History of pneumonia 09/16/2015   History reviewed. No pertinent surgical history. OB History     Gravida  0   Para  0   Term  0   Preterm  0   AB  0   Living  0      SAB  0   IAB  0   Ectopic  0   Multiple  0   Live Births             Home Medications    Prior to Admission medications   Medication Sig Start Date End Date Taking? Authorizing Provider  mupirocin ointment (BACTROBAN) 2 % Apply 1 application topically 2 (two) times daily. 08/04/21   Jerene Bears, MD  sulfamethoxazole-trimethoprim (BACTRIM DS) 800-160 MG tablet Take 1 tablet by mouth 2 (two) times daily. 08/04/21   Jerene Bears, MD  valACYclovir (VALTREX) 1000 MG tablet With onset of symptoms take 2 tabs and repeat in 12 hours. 08/04/21   Jerene Bears, MD    Family History Family History  Problem Relation Age of Onset   Breast cancer Sister 58       lumpectomy-A&W   Hypertension Mother    Hypertension Father    Heart attack Father 56   Hypertension Brother    Hypertension Sister    Breast cancer Sister 25       bilateral mastectomy- 2017     Hypertension Sister    Hyperlipidemia Brother    Social History Social History   Tobacco Use   Smoking status: Never   Smokeless tobacco: Never  Vaping Use   Vaping Use: Never used  Substance Use Topics   Alcohol use: Yes    Alcohol/week: 10.0 standard drinks of alcohol    Types: 10 Standard drinks or equivalent per week   Drug use: No   Allergies   Patient has no known allergies.  Review of Systems Review of Systems Pertinent findings revealed after performing a 14 point review of systems has been noted in the history of present illness.  Physical Exam Triage Vital Signs ED Triage Vitals  Enc Vitals Group     BP 09/10/21 0827 (!) 147/82     Pulse Rate 09/10/21 0827 72     Resp 09/10/21 0827 18     Temp 09/10/21 0827 98.3 F (36.8 C)     Temp Source 09/10/21 0827 Oral     SpO2 09/10/21 0827 98 %     Weight --      Height --  Head Circumference --      Peak Flow --      Pain Score 09/10/21 0826 5     Pain Loc --      Pain Edu? --      Excl. in GC? --   No data found.  Updated Vital Signs BP 136/86 (BP Location: Right Arm)   Pulse (!) 52   Temp 98.2 F (36.8 C) (Oral)   Resp 16   LMP 10/03/2015   SpO2 95%   Physical Exam Vitals and nursing note reviewed.  Constitutional:      General: She is not in acute distress.    Appearance: Normal appearance.  HENT:     Head: Normocephalic and atraumatic.  Eyes:     Pupils: Pupils are equal, round, and reactive to light.  Cardiovascular:     Rate and Rhythm: Normal rate and regular rhythm.  Pulmonary:     Effort: Pulmonary effort is normal.     Breath sounds: Normal breath sounds.  Musculoskeletal:        General: Normal range of motion.     Cervical back: Normal range of motion and neck supple.  Skin:    General: Skin is warm and dry.     Findings: Rash (Mons pubis, perineum, lower buttocks bilaterally, left hip, vesicular macular papular erythematous rash with mild excoriation) present.   Neurological:     General: No focal deficit present.     Mental Status: She is alert and oriented to person, place, and time. Mental status is at baseline.  Psychiatric:        Mood and Affect: Mood normal.        Behavior: Behavior normal.        Thought Content: Thought content normal.        Judgment: Judgment normal.     Visual Acuity Right Eye Distance:   Left Eye Distance:   Bilateral Distance:    Right Eye Near:   Left Eye Near:    Bilateral Near:     UC Couse / Diagnostics / Procedures:     Radiology No results found.  Procedures Procedures (including critical care time) EKG  Pending results:  Labs Reviewed - No data to display  Medications Ordered in UC: Medications  dexamethasone (DECADRON) injection 10 mg (has no administration in time range)    UC Diagnoses / Final Clinical Impressions(s)   I have reviewed the triage vital signs and the nursing notes.  Pertinent labs & imaging results that were available during my care of the patient were reviewed by me and considered in my medical decision making (see chart for details).    Final diagnoses:  Toxicodendron dermatitis   Based on patient's history and physical exam findings, patient is, to contact with some form of poison plant such as poison oak, ivy or sumac.  Patient provided with a steroid injection during her visit today and a tapering dose of prednisone.  Given that patient recently finished a prescription for Bactrim patient also provided with a prescription for Diflucan.  ED Prescriptions     Medication Sig Dispense Auth. Provider   predniSONE (DELTASONE) 10 MG tablet Take 4 tablets (40 mg total) by mouth daily for 5 days, THEN 2 tablets (20 mg total) daily for 5 days, THEN 1 tablet (10 mg total) daily for 5 days. 35 tablet Theadora Rama Scales, PA-C   fluconazole (DIFLUCAN) 150 MG tablet Take 1 tablet today.  Take second tablet 3 days later. 2  tablet Theadora Rama Scales, PA-C      PDMP  not reviewed this encounter.  Pending results:  Labs Reviewed - No data to display  Discharge Instructions:   Discharge Instructions      You received an injection during your visit today of steroids which should significantly calm the inflammation in your skin.  Tomorrow morning, please begin taking prednisone as prescribed, please do not feel that you need to take the full 40 mg (4 tablets) dose for a full 5 days, please only take this high-dose until you begin to see improvement of your symptoms.  Once you see improvement of your symptoms, please be sure that you complete the full 5 days of each tapering dose.  It is likely that having recently completed a course of antibiotics and taking steroids may cause you to develop a vaginal yeast infection.  I provided you with a prescription for Diflucan at your pharmacy should this occur.  Please do not feel that you need to pick this prescription up today, only pick it up if needed.  Please also let us know if you have not had complete relief of your symptoms after completing the steroids prescribed.  Thank you for visiting urgent care today.      Disposition Upon Discharge:  Condition: stable for discharge home  Patient presented with an acute illness with associated systemic symptoms and significant discomfort requiring urgent management. In my opinion, this is a condition that a prudent lay person (someone who possesses an average knowledge of health and medicine) may potentially expect to result in complications if not addressed urgently such as respiratory distress, impairment of bodily function or dysfunction of bodily organs.   Routine symptom specific, illness specific and/or disease specific instructions were discussed with the patient and/or caregiver at length.   As such, the patient has been evaluated and assessed, work-up was performed and treatment was provided in alignment with urgent care protocols and evidence based  medicine.  Patient/parent/caregiver has been advised that the patient may require follow up for further testing and treatment if the symptoms continue in spite of treatment, as clinically indicated and appropriate.  Patient/parent/caregiver has been advised to return to the [facility] or PCP if no better; to PCP or the Emergency Department if new signs and symptoms develop, or if the current signs or symptoms continue to change or worsen for further workup, evaluation and treatment as clinically indicated and appropriate  The patient will follow up with their current PCP if and as advised. If the patient does not currently have a PCP we will assist them in obtaining one.   The patient may need specialty follow up if the symptoms continue, in spite of conservative treatment and management, for further workup, evaluation, consultation and treatment as clinically indicated and appropriate.   Patient/parent/caregiver verbalized understanding and agreement of plan as discussed.  All questions were addressed during visit.  Please see discharge instructions below for further details of plan.  This office note has been dictated using Teaching laboratory technician.  Unfortunately, this method of dictation can sometimes lead to typographical or grammatical errors.  I apologize for your inconvenience in advance if this occurs.  Please do not hesitate to reach out to me if clarification is needed.      Theadora Rama Scales, [place] 06/02/22 [phone]

## 2022-06-01 NOTE — ED Triage Notes (Signed)
Pt c/o rash the left side of her body that appears yesterday and now in her vaginal area/ thighs.

## 2022-06-01 NOTE — ED Notes (Signed)
Reviewed with patient how to find PCP.

## 2022-06-01 NOTE — Discharge Instructions (Signed)
You received an injection during your visit today of steroids which should significantly calm the inflammation in your skin.  Tomorrow morning, please begin taking prednisone as prescribed, please do not feel that you need to take the full 40 mg (4 tablets) dose for a full 5 days, please only take this high-dose until you begin to see improvement of your symptoms.  Once you see improvement of your symptoms, please be sure that you complete the full 5 days of each tapering dose.  It is likely that having recently completed a course of antibiotics and taking steroids may cause you to develop a vaginal yeast infection.  I provided you with a prescription for Diflucan at your pharmacy should this occur.  Please do not feel that you need to pick this prescription up today, only pick it up if needed.  Please also let us know if you have not had complete relief of your symptoms after completing the steroids prescribed.  Thank you for visiting urgent care today.

## 2022-06-06 DIAGNOSIS — F329 Major depressive disorder, single episode, unspecified: Secondary | ICD-10-CM | POA: Diagnosis not present

## 2022-06-20 DIAGNOSIS — F329 Major depressive disorder, single episode, unspecified: Secondary | ICD-10-CM | POA: Diagnosis not present

## 2022-07-04 DIAGNOSIS — F329 Major depressive disorder, single episode, unspecified: Secondary | ICD-10-CM | POA: Diagnosis not present

## 2022-07-25 DIAGNOSIS — F329 Major depressive disorder, single episode, unspecified: Secondary | ICD-10-CM | POA: Diagnosis not present

## 2022-08-08 DIAGNOSIS — F329 Major depressive disorder, single episode, unspecified: Secondary | ICD-10-CM | POA: Diagnosis not present

## 2022-08-22 DIAGNOSIS — F329 Major depressive disorder, single episode, unspecified: Secondary | ICD-10-CM | POA: Diagnosis not present

## 2022-09-05 DIAGNOSIS — F329 Major depressive disorder, single episode, unspecified: Secondary | ICD-10-CM | POA: Diagnosis not present

## 2022-09-14 ENCOUNTER — Other Ambulatory Visit: Payer: Self-pay | Admitting: Obstetrics & Gynecology

## 2022-09-14 DIAGNOSIS — Z1231 Encounter for screening mammogram for malignant neoplasm of breast: Secondary | ICD-10-CM

## 2022-09-15 ENCOUNTER — Ambulatory Visit
Admission: RE | Admit: 2022-09-15 | Discharge: 2022-09-15 | Disposition: A | Discharge status: Home / Self Care | Payer: BC Managed Care – PPO | Source: Ambulatory Visit | Attending: Obstetrics & Gynecology | Admitting: Obstetrics & Gynecology

## 2022-09-15 DIAGNOSIS — Z1231 Encounter for screening mammogram for malignant neoplasm of breast: Secondary | ICD-10-CM

## 2022-09-19 DIAGNOSIS — F329 Major depressive disorder, single episode, unspecified: Secondary | ICD-10-CM | POA: Diagnosis not present

## 2022-10-19 DIAGNOSIS — F329 Major depressive disorder, single episode, unspecified: Secondary | ICD-10-CM | POA: Diagnosis not present

## 2022-10-31 DIAGNOSIS — F329 Major depressive disorder, single episode, unspecified: Secondary | ICD-10-CM | POA: Diagnosis not present

## 2022-11-30 ENCOUNTER — Encounter (HOSPITAL_BASED_OUTPATIENT_CLINIC_OR_DEPARTMENT_OTHER): Payer: Self-pay | Admitting: Obstetrics & Gynecology

## 2022-11-30 ENCOUNTER — Ambulatory Visit (INDEPENDENT_AMBULATORY_CARE_PROVIDER_SITE_OTHER): Payer: BC Managed Care – PPO | Admitting: Obstetrics & Gynecology

## 2022-11-30 VITALS — BP 128/80 | HR 55 | Ht 65.5 in | Wt 132.6 lb

## 2022-11-30 DIAGNOSIS — Z803 Family history of malignant neoplasm of breast: Secondary | ICD-10-CM | POA: Diagnosis not present

## 2022-11-30 DIAGNOSIS — B001 Herpesviral vesicular dermatitis: Secondary | ICD-10-CM

## 2022-11-30 DIAGNOSIS — Z01419 Encounter for gynecological examination (general) (routine) without abnormal findings: Secondary | ICD-10-CM

## 2022-11-30 DIAGNOSIS — Z780 Asymptomatic menopausal state: Secondary | ICD-10-CM | POA: Diagnosis not present

## 2022-11-30 DIAGNOSIS — L299 Pruritus, unspecified: Secondary | ICD-10-CM

## 2022-11-30 DIAGNOSIS — Z9189 Other specified personal risk factors, not elsewhere classified: Secondary | ICD-10-CM | POA: Diagnosis not present

## 2022-11-30 MED ORDER — HYDROXYZINE HCL 10 MG PO TABS: 10.0000 mg | ORAL_TABLET | Freq: Three times a day (TID) | ORAL | 0 refills | Status: AC | PRN

## 2022-11-30 MED ORDER — VALACYCLOVIR HCL 1 G PO TABS: ORAL_TABLET | ORAL | 1 refills | Status: DC

## 2022-11-30 NOTE — Progress Notes (Addendum)
57 y.o. G0P0000 Married White or Caucasian female here for annual exam.  Had a vulvar contact dermatitis this summer.  Ended up in urgent care and on steroids.  This did resolve the issue.  Does have some scarring from this on her hip.  Has feel a little itching intermittently since then but doesn't really have the rash.  Itching can be intense.  Options for symptoms relief discussed.  Needs RF for valtrex.  Uses for fever blisters.  Denies vaginal bleeding.    Patient's last menstrual period was 10/03/2015.          Sexually active: Yes.    The current method of family planning is post menopausal status.    Exercising: Yes.     Smoker:  no  Health Maintenance: Pap:  08/04/21 neg History of abnormal Pap:  no MMG:  09/15/22 neg Colonoscopy:  02/03/22, Dr. Watt Climes.  Follow up 5 years.   BMD:   not indicated Screening Labs: will plan fasting next year   reports that she has never smoked. She has never used smokeless tobacco. She reports current alcohol use of about 10.0 standard drinks of alcohol per week. She reports that she does not use drugs.  Past Medical History:  Diagnosis Date   Achilles tendon tear    OAB (overactive bladder)    PNA (pneumonia)     History reviewed. No pertinent surgical history.  Current Outpatient Medications  Medication Sig Dispense Refill   hydrOXYzine (ATARAX) 10 MG tablet Take 1 tablet (10 mg total) by mouth 3 (three) times daily as needed for itching. 30 tablet 0   mupirocin ointment (BACTROBAN) 2 % Apply 1 application topically 2 (two) times daily. 15 g 0   valACYclovir (VALTREX) 1000 MG tablet Take 1 tabs as symptom onset and repeat in 12 hours. 30 tablet 1   No current facility-administered medications for this visit.    Family History  Problem Relation Age of Onset   Breast cancer Sister 58       lumpectomy-A&W   Hypertension Mother    Hypertension Father    Heart attack Father 82   Hypertension Brother    Hypertension Sister    Breast  cancer Sister 7       bilateral mastectomy- 2017    Hypertension Sister    Hyperlipidemia Brother     ROS: Constitutional: negative Genitourinary:negative  Exam:   BP 128/80   Pulse (!) 55   Ht 5' 5.5" (1.664 m)   Wt 132 lb 9.6 oz (60.1 kg)   LMP 10/03/2015   BMI 21.73 kg/m   Height: 5' 5.5" (166.4 cm)  General appearance: alert, cooperative and appears stated age Head: Normocephalic, without obvious abnormality, atraumatic Neck: no adenopathy, supple, symmetrical, trachea midline and thyroid normal to inspection and palpation Lungs: clear to auscultation bilaterally Breasts: normal appearance, no masses or tenderness Heart: regular rate and rhythm Abdomen: soft, non-tender; bowel sounds normal; no masses,  no organomegaly Extremities: extremities normal, atraumatic, no cyanosis or edema Skin: Skin color, texture, turgor normal. No rashes or lesions Lymph nodes: Cervical, supraclavicular, and axillary nodes normal. No abnormal inguinal nodes palpated Neurologic: Grossly normal   Pelvic: External genitalia:  no lesions              Urethra:  normal appearing urethra with no masses, tenderness or lesions              Bartholins and Skenes: normal  Vagina: normal appearing vagina with normal color and no discharge, no lesions              Cervix: no lesions              Pap taken: No. Bimanual Exam:  Uterus:  normal size, contour, position, consistency, mobility, non-tender              Adnexa: normal adnexa and no mass, fullness, tenderness               Rectovaginal: Confirms               Anus:  normal sphincter tone, no lesions  Chaperone, Octaviano Batty, CMA, was present for exam.  Assessment/Plan: 1. Well woman exam with routine gynecological exam - Pap smear 07/2021 with neg HR HPV.  Not indicated today. - Mammogram 09/2022 - Colonoscopy 01/2022 - Bone mineral density will be planned after age 24 - lab work done will be done next year - vaccines  reviewed/updated  2. Postmenopausal - not on HRT  3. Family history of breast cancer  4. Increased risk of breast cancer - will plan breast MRI this year  5. Itching - will try atarax with next episode - hydrOXYzine (ATARAX) 10 MG tablet; Take 1 tablet (10 mg total) by mouth 3 (three) times daily as needed for itching.  Dispense: 30 tablet; Refill: 0  6. Fever blister - valACYclovir (VALTREX) 1000 MG tablet; Take 1 tabs as symptom onset and repeat in 12 hours.  Dispense: 30 tablet; Refill: 1

## 2022-12-01 ENCOUNTER — Other Ambulatory Visit: Payer: Self-pay | Admitting: Sports Medicine

## 2022-12-01 ENCOUNTER — Ambulatory Visit: Payer: BC Managed Care – PPO | Admitting: Sports Medicine

## 2022-12-01 ENCOUNTER — Ambulatory Visit: Payer: Self-pay

## 2022-12-01 VITALS — BP 136/80 | Ht 66.0 in | Wt 129.0 lb

## 2022-12-01 DIAGNOSIS — M7662 Achilles tendinitis, left leg: Secondary | ICD-10-CM

## 2022-12-01 MED ORDER — NITROGLYCERIN 0.2 MG/HR TD PT24: MEDICATED_PATCH | TRANSDERMAL | 1 refills | Status: AC

## 2022-12-01 NOTE — Patient Instructions (Signed)

## 2022-12-01 NOTE — Progress Notes (Signed)
Whitney Frederick - 57 y.o. female MRN [id number]  Date of birth: 1966/05/06    CHIEF COMPLAINT:   L heel pain    SUBJECTIVE:   HPI:  Pleasant 57 year old patient comes to clinic to be evaluated for left heel pain.  She has her left heel is been hurting for about 3 months now.  She is very active runner.  She has run one half marathon since October.  However, she is generally decreased her mileage over the last several months.  She has a history of left Achilles partial tear from about 10 years ago.  With that injury 10 years ago, she felt a sharp immediate pain when running.  Over the last several months, she has not had a single sharp painful experience.  It is rather been a dull ache constantly aggravated with running.  She does not have much pain with day-to-day activities.  She does feel that the foot is a little stiff in the morning, she has some difficulty with plantar flexion in the mornings or when getting up from a prolonged sitting position.  She denies any numbness or tingling foot.  Denies any ecchymoses or erythema.  ROS:     See HPI  PERTINENT  PMH / PSH FH / / SH:  Past Medical, Surgical, Social, and Family History Reviewed & Updated in the EMR.  Pertinent findings include:  Prior left Achilles tear 10 years ago  OBJECTIVE: BP 136/80   Ht 5\' 6"  (1.676 m)   Wt 129 lb (58.5 kg)   LMP 10/03/2015   BMI 20.82 kg/m   Physical Exam:  Vital signs are reviewed.  GEN: Alert and oriented, NAD Pulm: Breathing unlabored PSY: normal mood, congruent affect  MSK: Left foot -no obvious deformity.  No erythema.  No ecchymoses.  She is nontender to palpation at the medial and lateral malleoli, navicular, base of fifth metatarsal.  She is nontender to palpation over the Achilles.  She has full range of motion of the ankle in all directions.  5/5 strength without.  1+ anterior drawer test.  Negative talar tilt test.  Negative Thompson test.  She is neurovascular intact  distally   ULTRASOUND: [place] Left ankle:  - Achilles tendon: There is approximately 30% tear of the Achilles tendon about 1 to 2 cm proximal to the insertion on the calcaneus.  This tear is visible in both long and short axis.  The width of the Achilles tendon at the insertion on the calcaneus is 0.84 cm. There is also some calcific tendinopathy at the insertion on the calcaneus.  There is no neovascularization present at the tear.  [place] Right Ankle -Achilles tendon: The with the Achilles tendon 1 cm proximal to the insertion on the calcaneus is 0.56 m  Impression: 30% partial thickness tear of the left Achilles tendon with signs of chronic Achilles tendinopathy.  Ultrasound and interpretation by Dr. Nilda Calamity and Wolfgang Phoenix. Fields, MD   ASSESSMENT & PLAN:  1.  Left Achilles partial-thickness tear  -Patient presents with left heel pain is found to have partial-thickness achilles tear on ultrasound today.  This is likely an exacerbation of her old pathology from years ago.  We discussed with patient treatment options and we will treat this conservatively with heel lifts, nitroglycerin patches protocol, and Alfredson's exercises.  Will have her follow-up in 6 weeks or so to check her progress.  If no improvement, would start formal physical therapy.  Dortha Kern, MD PGY-4, Sports Medicine Fellow [facility]  I observed and examined the patient with the [facility] resident and agree with assessment and plan.  Note reviewed and modified by me. Ila Mcgill, MD

## 2022-12-05 ENCOUNTER — Other Ambulatory Visit (HOSPITAL_BASED_OUTPATIENT_CLINIC_OR_DEPARTMENT_OTHER): Payer: Self-pay | Admitting: Obstetrics & Gynecology

## 2022-12-05 DIAGNOSIS — B001 Herpesviral vesicular dermatitis: Secondary | ICD-10-CM

## 2022-12-12 DIAGNOSIS — F329 Major depressive disorder, single episode, unspecified: Secondary | ICD-10-CM | POA: Diagnosis not present

## 2022-12-13 ENCOUNTER — Ambulatory Visit: Payer: BC Managed Care – PPO | Admitting: Sports Medicine

## 2022-12-26 DIAGNOSIS — F329 Major depressive disorder, single episode, unspecified: Secondary | ICD-10-CM | POA: Diagnosis not present

## 2023-01-09 DIAGNOSIS — F329 Major depressive disorder, single episode, unspecified: Secondary | ICD-10-CM | POA: Diagnosis not present

## 2023-01-12 ENCOUNTER — Ambulatory Visit (INDEPENDENT_AMBULATORY_CARE_PROVIDER_SITE_OTHER): Payer: BC Managed Care – PPO | Admitting: Sports Medicine

## 2023-01-12 VITALS — BP 128/82 | Ht 66.0 in | Wt 129.0 lb

## 2023-01-12 DIAGNOSIS — M7662 Achilles tendinitis, left leg: Secondary | ICD-10-CM | POA: Diagnosis not present

## 2023-01-12 NOTE — Progress Notes (Signed)
PCP: Pcp, No  Subjective:   HPI: Patient is a 57 y.o. female here for follow-up of left Achilles partial thickness tear.  Was seen on 12/01/2022 and given heel lifts, nitroglycerin patches, alfredson's exercises. She has been doing the exercises regularly. States there is some improvement with motion but still has a little pain when putting pressure on the ball of her foot.  Stiffness in the morning.  She has not been running since her last visit, would really like to start back if possible.   BP 128/82   Ht '5\' 6"'$  (1.676 m)   Wt 129 lb (58.5 kg)   LMP 10/03/2015   BMI 20.82 kg/m   Objective:  Physical Exam:  Gen: awake, alert, NAD, comfortable in exam room Pulm: breathing unlabored Ankle: - Inspection: No obvious deformity, erythema, swelling, or ecchymosis, ulcers, calluses, blisters - Palpation: No TTP at MT heads, no TTP at base of 5th MT,  no TTP over lateral or medial malleolus, no TTP of Achilles.  - Strength: Normal strength with dorsiflexion, plantarflexion, inversion, and eversion of foot; flexion and extension of toes - ROM: Full ROM - Neuro/vasc: NV intact  [place] LAchilles tendon:  Approximately 14% tear of Achilles tendon about 1 cm proximal to insertion of calcaneus. AP thickness is now 0.63 and much closer to her Rt side Minimal doppler activity Much improved appearance.  Impression:  Achilles tendinopathy with small partial tear     Assessment & Plan:  1.  Left Achilles partial-thickness tear Partial Achilles tear has decreased from 30% to 14% thickness.  With the significant improvement, she can begin running again.  She was previously running 30 miles per week and advised start slow at 15 miles per week for 3 weeks, can increase by 5 miles thereafter per week until she is back at 30.  Reck 6 weeks  Precious Gilding, DO Family medicine resident, PGY-2  I observed and examined the patient with the resident and agree with assessment and plan.  Note reviewed and  modified by me. Ila Mcgill, MD

## 2023-01-12 NOTE — Assessment & Plan Note (Signed)
This is a new flare of AT now about 6 weeks into treatment  Doing really well and we can gradually restart running on flat surfaces

## 2023-01-23 DIAGNOSIS — F329 Major depressive disorder, single episode, unspecified: Secondary | ICD-10-CM | POA: Diagnosis not present

## 2023-02-06 DIAGNOSIS — F329 Major depressive disorder, single episode, unspecified: Secondary | ICD-10-CM | POA: Diagnosis not present

## 2023-02-13 ENCOUNTER — Encounter (HOSPITAL_BASED_OUTPATIENT_CLINIC_OR_DEPARTMENT_OTHER): Payer: Self-pay | Admitting: Obstetrics & Gynecology

## 2023-02-23 ENCOUNTER — Ambulatory Visit: Payer: BC Managed Care – PPO | Admitting: Sports Medicine

## 2023-02-23 ENCOUNTER — Other Ambulatory Visit: Payer: Self-pay

## 2023-02-23 VITALS — BP 124/78 | Ht 66.0 in | Wt 129.0 lb

## 2023-02-23 DIAGNOSIS — M7662 Achilles tendinitis, left leg: Secondary | ICD-10-CM

## 2023-02-23 NOTE — Progress Notes (Signed)
  Whitney Frederick - 57 y.o. female MRN [id number]  Date of birth: 07/17/1966    CHIEF COMPLAINT:   Left Achilles injury    SUBJECTIVE:   HPI:  Here for 6-week follow-up of Achilles partial-thickness tear.  She has had some good days and some bad days.  She has not been able to progress her running like she was hoping.  She has not been able to get past 15 miles a week.  She has been doing 1 mile jog/1 mile walk for up to 8 miles at a time and seems to do okay with this.  However if she tries to do longer runs she feels pain in the left Achilles and is noticeable limp.  She has been continuing her Whitney Frederick's exercises and nitroglycerin patches.  ROS:     See HPI  PERTINENT  PMH / PSH FH / / SH:  Past Medical, Surgical, Social, and Family History Reviewed & Updated in the EMR.  Pertinent findings include:  none  OBJECTIVE: BP 124/78   Ht 5\' 6"  (1.676 m)   Wt 129 lb (58.5 kg)   LMP 10/03/2015   BMI 20.82 kg/m   Physical Exam:  Vital signs are reviewed.  GEN: Alert and oriented, NAD Pulm: Breathing unlabored PSY: normal mood, congruent affect  MSK: L Foot -no obvious deformity.  No erythema.  No effusion.  Nontender to palpation of the Achilles.  Nontender palpation ankle.  Full range of motion of the ankle in all directions.  5/5 strength.  Negative Thompson test.  Ultrasound: [place] left ankle:  - Achilles tendon: There is still a partial-thickness tear at the bottom portion of the Achilles tendon just distal to its insertion on the calcaneus.  This is still at about a 30 to 40% tear.  This is also seen in the short axis where there is hypoechoic changes at the bottom of the tendon there as well.  Thickness of the Achilles tendon is 0.68 cm in the long axis, which is decreased ifrom initial 0.82.  The width of the tendon and short axis is 1.6 cm, which is decreased from prior width of 2.02  There is also neovascularization present at the area of the partial tear that was not present  on prior exam.  Impression: Partial-thickness tear with signs of healing By Dr. Jordan Frederick and Dr. Darrick Frederick on 02/23/2023  ASSESSMENT & PLAN:  1.  Left Achilles partial-thickness tear  -Reassurance was provided that she does have signs of healing today.  Will have to progress her more slowly than originally anticipated.  Have her continue Whitney Frederick's exercises and Nitropatch as.  Will have her add a heel lift to her shoe to take some strain off of the Achilles.  Will have her start her running back at 10 miles a week and then can slowly progress to 15 to 18 miles a week as tolerated.  Will recheck her progress in 1 month.  If no improvement, she may be a candidate for shockwave.  Whitney Nigh, MD PGY-4, Sports Medicine Fellow [facility]  I observed and examined the patient with the resident and agree with assessment and plan.  Note reviewed and modified by me. Whitney Big, MD

## 2023-03-06 DIAGNOSIS — F329 Major depressive disorder, single episode, unspecified: Secondary | ICD-10-CM | POA: Diagnosis not present

## 2023-03-20 DIAGNOSIS — F329 Major depressive disorder, single episode, unspecified: Secondary | ICD-10-CM | POA: Diagnosis not present

## 2023-03-23 ENCOUNTER — Ambulatory Visit: Payer: BC Managed Care – PPO | Admitting: Sports Medicine

## 2023-03-23 VITALS — BP 119/82 | Ht 66.0 in

## 2023-03-23 DIAGNOSIS — M7662 Achilles tendinitis, left leg: Secondary | ICD-10-CM | POA: Diagnosis not present

## 2023-03-23 NOTE — Progress Notes (Signed)
  Whitney Frederick - 57 y.o. female MRN [id number]  Date of birth: June 25, 1966    CHIEF COMPLAINT:   Left Achilles follow-up    SUBJECTIVE:   HPI:  Pleasant 57 year old female here to follow-up left Achilles partial tendon tear.  She is doing okay today.  She has been continuing to do her Alfredson's exercises and nitro patches.  She has been doing a jog/walk program with some of her friends and is still at about 10 to 12 miles per week.  She has also been doing some biking.  The Achilles is still a little stiff after she finishes a run.  She would like to discuss more treatment options today.  ROS:     See HPI  PERTINENT  PMH / PSH FH / / SH:  Past Medical, Surgical, Social, and Family History Reviewed & Updated in the EMR.  Pertinent findings include:  none  OBJECTIVE: BP 119/82   Ht 5\' 6"  (1.676 m)   LMP 10/03/2015   BMI 20.82 kg/m   Physical Exam:  Vital signs are reviewed.  GEN: Alert and oriented, NAD Pulm: Breathing unlabored PSY: normal mood, congruent affect  MSK: Left foot -no overlying skin changes at the Achilles.  Nontender to palpation at the Achilles.  Full range of motion ankle in all directions.  5/5 strength with resisted plantarflexion and dorsiflexion.  She is able to perform a heel raise on the left foot.  ASSESSMENT & PLAN:  1.  Left Achilles partial-thickness tear  -Patient has made somewhat reasonable but slow progress with healing her partial-thickness tear of her Achilles.  She would like to try supplementing her healing with a trial of shockwave therapy today.  Will do her first treatment today.  Will have her continue to do her Alfredson's exercises and nitroglycerin patches as before.  She will continue the same walk/jog regimen.  Will have her call back to the office early next week to see how she is doing.  If she has noticed improvement, she will come back next week for another treatment with shockwave.  Recommended we try about 4-6 treatments to  start  Procedure: ECSWT Indications: Achilles partial-thickness tear   Procedure Details Consent: Risks of procedure as well as the alternatives and risks of each were explained to the patient.  Written consent for procedure obtained. Time Out: Verified patient identification, verified procedure, site was marked, verified correct patient position, medications/allergies/relevent history reviewed.  The area was cleaned with alcohol swab.     The left Achilles and calf was targeted for Extracorporeal shockwave therapy.    Preset: Achilles Power Level: 120 Frequency: 10 Impulse/cycles: 2500 (200 on achilles and 500 on calf) Head size: small   Patient tolerated procedure well without immediate complications    Arvella Nigh, MD PGY-4, Sports Medicine Fellow [facility]  I observed and examined the patient with the [facility] resident and agree with assessment and plan.  Note reviewed and modified by me. Sterling Big, MD

## 2023-04-05 ENCOUNTER — Ambulatory Visit (INDEPENDENT_AMBULATORY_CARE_PROVIDER_SITE_OTHER): Payer: Self-pay | Admitting: Sports Medicine

## 2023-04-05 VITALS — Ht 66.0 in

## 2023-04-05 DIAGNOSIS — M7662 Achilles tendinitis, left leg: Secondary | ICD-10-CM

## 2023-04-05 NOTE — Progress Notes (Unsigned)
  Whitney Frederick - 57 y.o. female MRN [id number]  Date of birth: 10-15-1966    CHIEF COMPLAINT:   L achilles tendonitis    SUBJECTIVE:   HPI:  Pleasant 57 year old female here for shockwave treatment #2 left Achilles.  She says she feels about 40% better after her first treatment which was 2 weeks ago.  She has continued to walk and jog.  She is up to 16 miles a week.  We will do her treatment #2 today and to follow-up in 1 week for treatment of her 3.  Procedure: ECSWT Indications:  L achilles partial thickness tear   Procedure Details Consent: Risks of procedure as well as the alternatives and risks of each were explained to the patient.  Written consent for procedure obtained. Time Out: Verified patient identification, verified procedure, site was marked, verified correct patient position, medications/allergies/relevent history reviewed.  The area was cleaned with alcohol swab.     The L achilles was targeted for Extracorporeal shockwave therapy.    Preset: Achillodynia Power Level: 120 Frequency: 10 Impulse/cycles: 2500 (2000 in achilles and 500 on calf) Head size: small   Patient tolerated procedure well without immediate complications     Arvella Nigh, MD PGY-4, Sports Medicine Fellow [facility]  Addendum:  I was the preceptor for this visit and available for immediate consultation.  Norton Blizzard MD Marrianne Mood

## 2023-04-11 IMAGING — MG MM DIGITAL SCREENING BILAT W/ TOMO AND CAD
8 series · 9 of 24 positions shown · non-contrast
Comparison: Previous exam(s).

CLINICAL DATA: Screening.

EXAM:
DIGITAL SCREENING BILATERAL MAMMOGRAM WITH TOMOSYNTHESIS AND CAD
TECHNIQUE: Bilateral screening digital craniocaudal and mediolateral oblique
mammograms were obtained. Bilateral screening digital breast
tomosynthesis was performed. The images were evaluated with
computer-aided detection.

[R CC synth-2D]
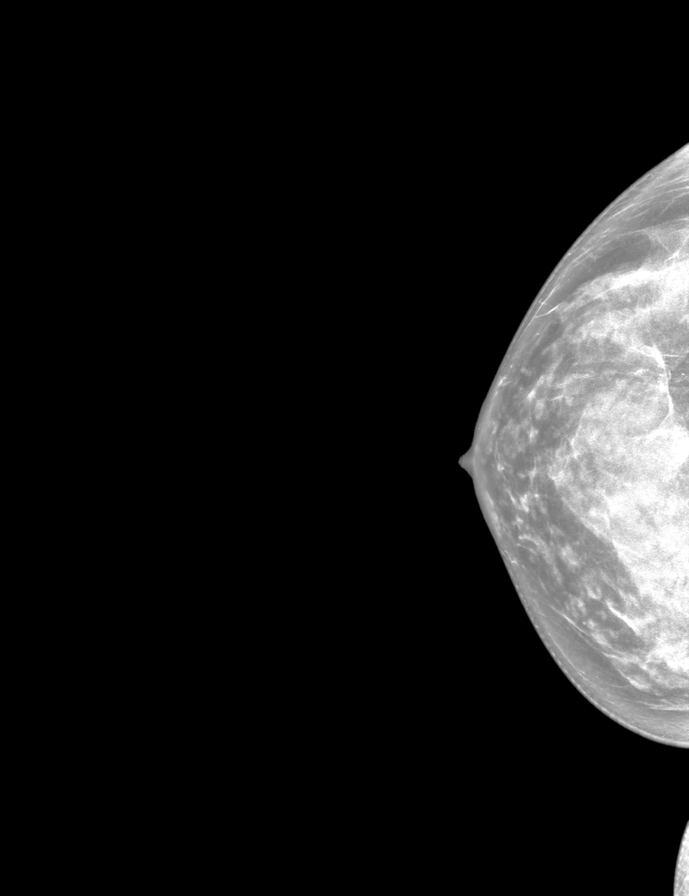

[R MLO synth-2D]
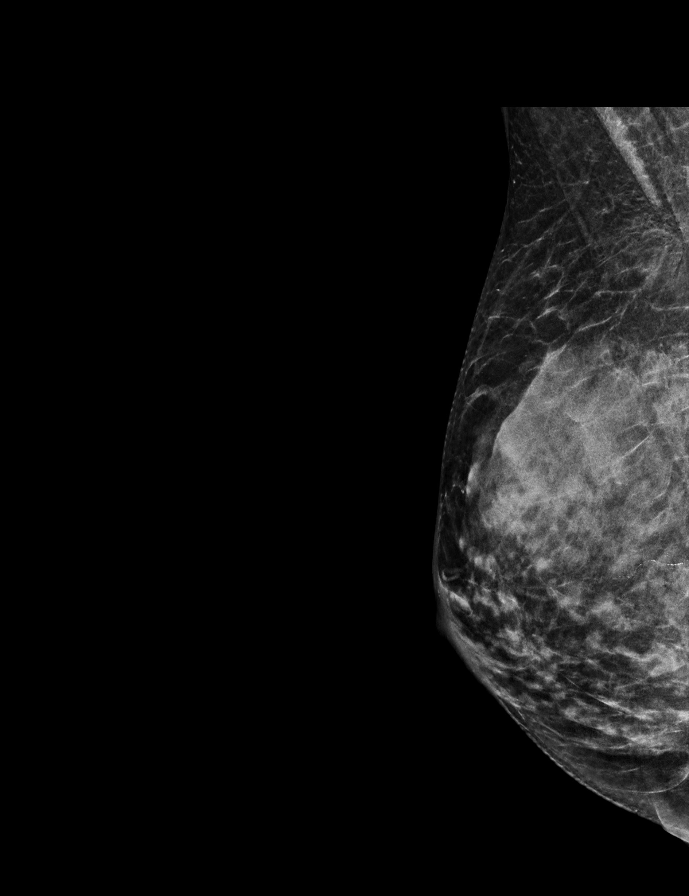

[L MLO synth-2D]
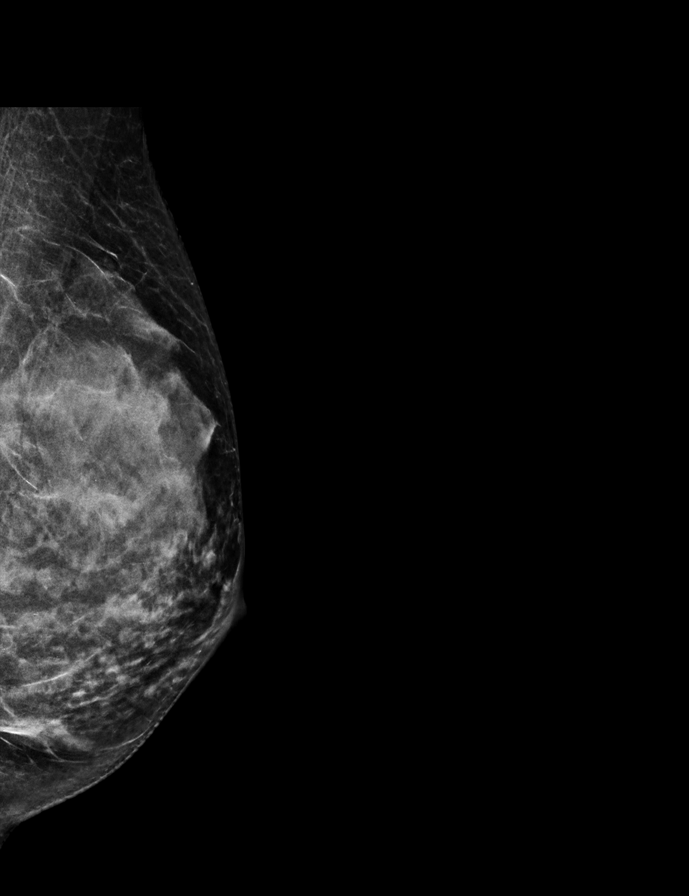

[L CC synth-2D]
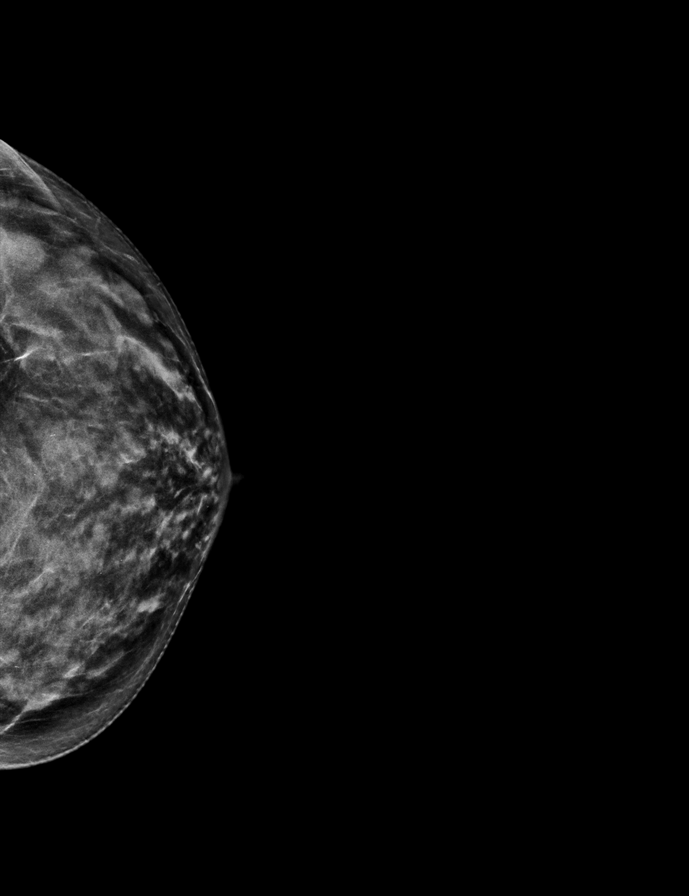

[L CC tomo · 2 of 52 frames shown]
[frame 17/52]
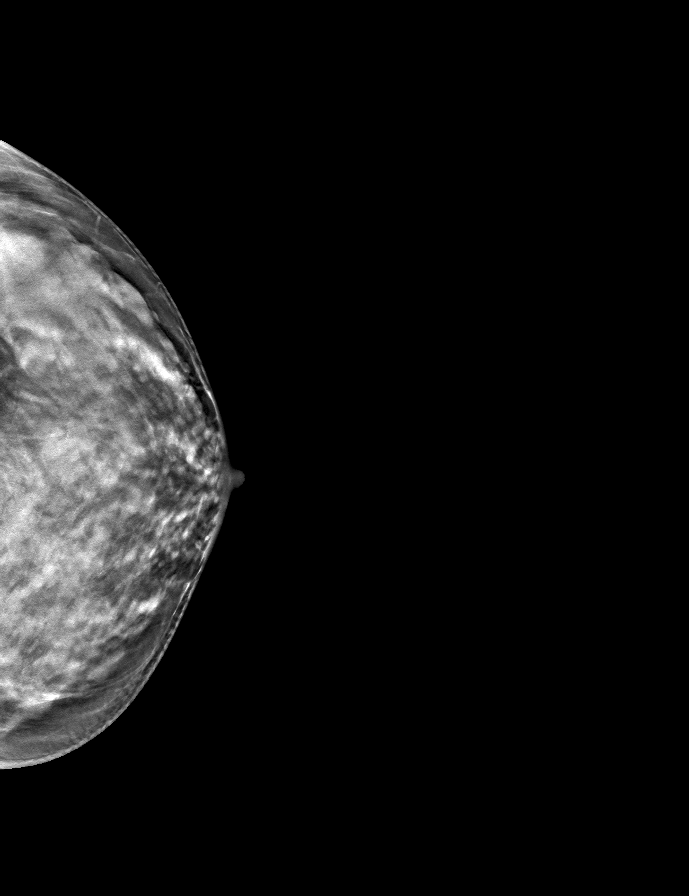
[frame 27/52]
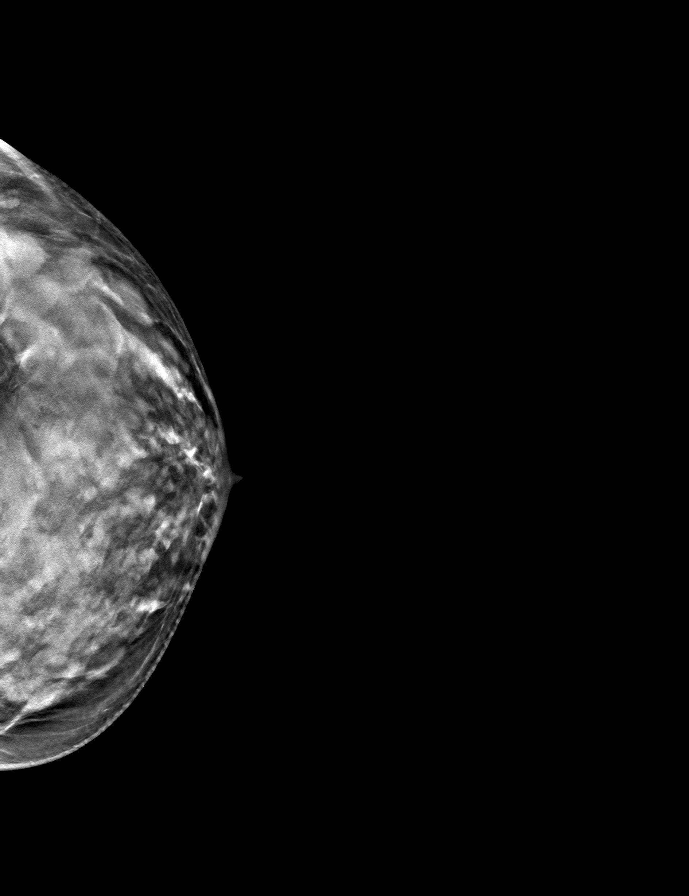

[R CC tomo · tomo slice 29/58.0]
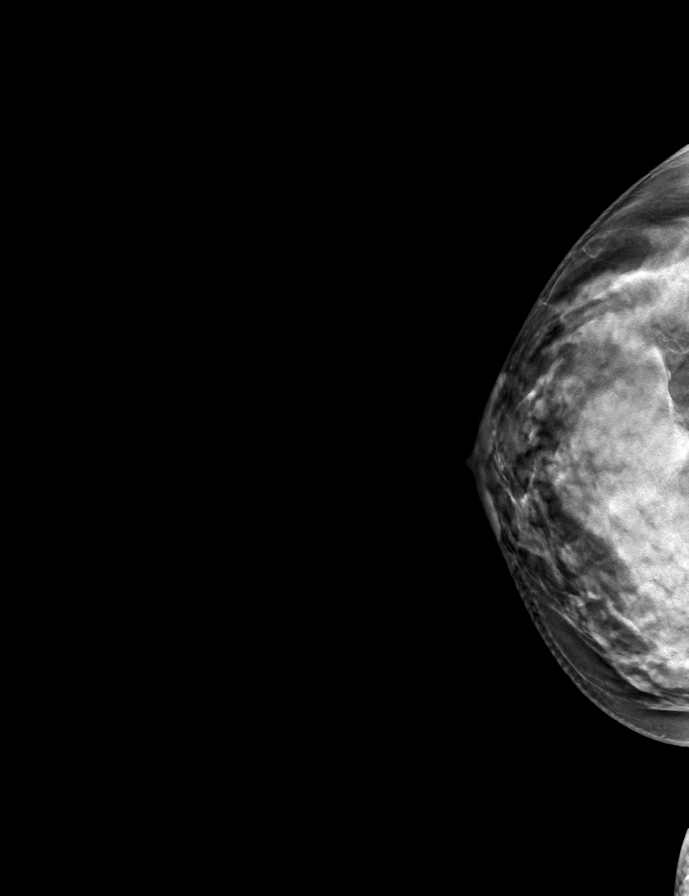

[L MLO tomo · tomo slice 25/50.0]
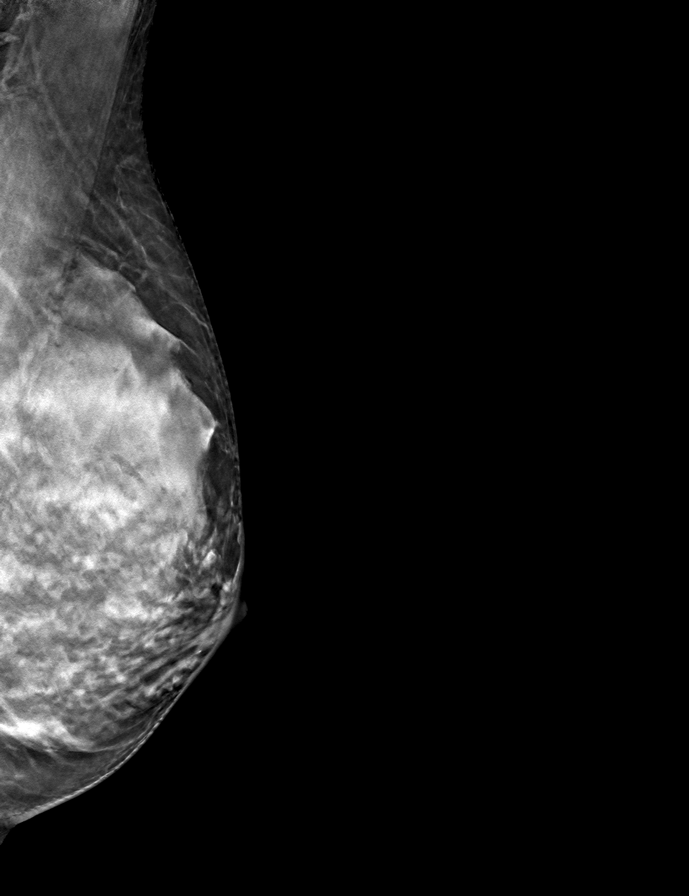

[R MLO tomo · tomo slice 25/49.0]
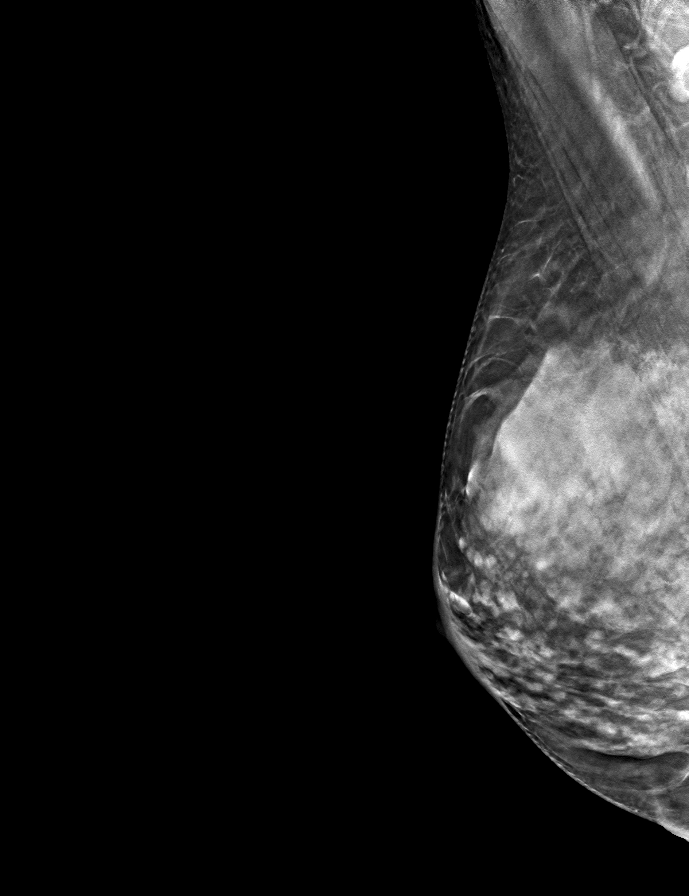

[9 of 24 positions shown; findings below may reference images not displayed]

ACR Breast Density Category d: The breast tissue is extremely dense,
which lowers the sensitivity of mammography
FINDINGS: There are no findings suspicious for malignancy.
IMPRESSION: No mammographic evidence of malignancy. A result letter of this
screening mammogram will be mailed directly to the patient.

RECOMMENDATION:
Screening mammogram in one year. (Code:[id number]-V-WV9)

BI-RADS CATEGORY  1: Negative.

## 2023-04-15 ENCOUNTER — Ambulatory Visit
Admission: EM | Admit: 2023-04-15 | Discharge: 2023-04-15 | Disposition: A | Discharge status: Home / Self Care | Payer: BC Managed Care – PPO | Attending: Nurse Practitioner | Admitting: Nurse Practitioner

## 2023-04-15 ENCOUNTER — Ambulatory Visit (INDEPENDENT_AMBULATORY_CARE_PROVIDER_SITE_OTHER): Payer: BC Managed Care – PPO

## 2023-04-15 DIAGNOSIS — J069 Acute upper respiratory infection, unspecified: Secondary | ICD-10-CM

## 2023-04-15 DIAGNOSIS — R059 Cough, unspecified: Secondary | ICD-10-CM | POA: Diagnosis not present

## 2023-04-15 DIAGNOSIS — R051 Acute cough: Secondary | ICD-10-CM

## 2023-04-15 DIAGNOSIS — R509 Fever, unspecified: Secondary | ICD-10-CM | POA: Diagnosis not present

## 2023-04-15 MED ORDER — PROMETHAZINE-DM 6.25-15 MG/5ML PO SYRP: 5.0000 mL | ORAL_SOLUTION | Freq: Four times a day (QID) | ORAL | 0 refills | Status: DC | PRN

## 2023-04-15 NOTE — Discharge Instructions (Signed)
Your chest x-ray was negative for pneumonia.  Your symptoms and exam are consistent with a viral upper respiratory illness Promethazine DM as needed for cough.  Please note this medication can make you drowsy.  Do not drink alcohol or drive on this medication You can use over-the-counter decongestants as needed Rest and fluids Follow-up with your PCP if your symptoms do not improve Please go to the ER for any worsening symptoms

## 2023-04-15 NOTE — ED Provider Notes (Signed)
[facility]    CSN: [id number] Arrival date & time: 04/15/23  1042      History   Chief Complaint Chief Complaint  Patient presents with   Cough    Low grade fever - Entered by patient    HPI Whitney Frederick is a 57 y.o. female  presents for evaluation of URI symptoms for 3 days. Patient reports associated symptoms of cough, sore throat (that is improving), congestion, fever.  Denies N/V/D, ear pain, body aches, shortness of breath. Patient does not have a hx of asthma or smoking.  Report she has been around a couple of sick individuals as well as someone with pneumonia.  Pt has taken DayQuil OTC for symptoms.  Reports 3 negative home COVID test.  Pt has no other concerns at this time.    Cough Associated symptoms: fever and sore throat     Past Medical History:  Diagnosis Date   Achilles tendon tear    OAB (overactive bladder)    PNA (pneumonia)     Patient Active Problem List   Diagnosis Date Noted   Postmenopausal 08/04/2021   Family history of breast cancer 08/04/2021   Increased risk of breast cancer 08/04/2021   History of pneumonia 09/16/2015    History reviewed. No pertinent surgical history.  OB History     Gravida  0   Para  0   Term  0   Preterm  0   AB  0   Living  0      SAB  0   IAB  0   Ectopic  0   Multiple  0   Live Births               Home Medications    Prior to Admission medications   Medication Sig Start Date End Date Taking? Authorizing Provider  promethazine-dextromethorphan (PROMETHAZINE-DM) 6.25-15 MG/5ML syrup Take 5 mLs by mouth 4 (four) times daily as needed for cough. 04/15/23  Yes Radford Pax, NP  hydrOXYzine (ATARAX) 10 MG tablet Take 1 tablet (10 mg total) by mouth 3 (three) times daily as needed for itching. 11/30/22   Jerene Bears, MD  mupirocin ointment (BACTROBAN) 2 % Apply 1 application topically 2 (two) times daily. 08/04/21   Jerene Bears, MD  nitroGLYCERIN (NITRODUR - DOSED IN  MG/24 HR) 0.2 mg/hr patch Use 1/4 patch daily to the affected area. 12/01/22   Enid Baas, MD  valACYclovir (VALTREX) 1000 MG tablet TAKE 1 TABS AT SYMPTOM ONSET AND REPEAT IN 12 HOURS. 12/05/22   Jerene Bears, MD    Family History Family History  Problem Relation Age of Onset   Breast cancer Sister 29       lumpectomy-A&W   Hypertension Mother    Hypertension Father    Heart attack Father 9   Hypertension Brother    Hypertension Sister    Breast cancer Sister 88       bilateral mastectomy- 2017    Hypertension Sister    Hyperlipidemia Brother     Social History Social History   Tobacco Use   Smoking status: Never   Smokeless tobacco: Never  Vaping Use   Vaping Use: Never used  Substance Use Topics   Alcohol use: Yes    Alcohol/week: 10.0 standard drinks of alcohol    Types: 10 Standard drinks or equivalent per week   Drug use: No     Allergies   Patient has no known allergies.  Review of Systems Review of Systems  Constitutional:  Positive for fever.  HENT:  Positive for congestion and sore throat.   Respiratory:  Positive for cough.      Physical Exam Triage Vital Signs ED Triage Vitals  Enc Vitals Group     BP 04/15/23 1217 (!) 168/97     Pulse Rate 04/15/23 1217 68     Resp 04/15/23 1217 18     Temp 04/15/23 1217 99.4 F (37.4 C)     Temp Source 04/15/23 1217 Oral     SpO2 04/15/23 1217 97 %     Weight --      Height --      Head Circumference --      Peak Flow --      Pain Score 04/15/23 1220 0     Pain Loc --      Pain Edu? --      Excl. in GC? --    No data found.  Updated Vital Signs BP (!) 168/97 (BP Location: Left Arm)   Pulse 68   Temp 99.4 F (37.4 C) (Oral)   Resp 18   LMP 10/03/2015   SpO2 97%   Visual Acuity Right Eye Distance:   Left Eye Distance:   Bilateral Distance:    Right Eye Near:   Left Eye Near:    Bilateral Near:     Physical Exam Vitals and nursing note reviewed.  Constitutional:      General:  She is not in acute distress.    Appearance: She is well-developed. She is not ill-appearing.  HENT:     Head: Normocephalic and atraumatic.     Right Ear: Tympanic membrane and ear canal normal.     Left Ear: Tympanic membrane and ear canal normal.     Nose: Congestion present.     Mouth/Throat:     Mouth: Mucous membranes are moist.     Pharynx: Oropharynx is clear. Uvula midline. Posterior oropharyngeal erythema present.     Tonsils: No tonsillar exudate or tonsillar abscesses.  Eyes:     Conjunctiva/sclera: Conjunctivae normal.     Pupils: Pupils are equal, round, and reactive to light.  Cardiovascular:     Rate and Rhythm: Normal rate and regular rhythm.     Heart sounds: Normal heart sounds.  Pulmonary:     Effort: Pulmonary effort is normal.     Breath sounds: Normal breath sounds.  Musculoskeletal:     Cervical back: Normal range of motion and neck supple.  Lymphadenopathy:     Cervical: No cervical adenopathy.  Skin:    General: Skin is warm and dry.  Neurological:     General: No focal deficit present.     Mental Status: She is alert and oriented to person, place, and time.  Psychiatric:        Mood and Affect: Mood normal.        Behavior: Behavior normal.      UC Treatments / Results  Labs (all labs ordered are listed, but only abnormal results are displayed) Labs Reviewed - No data to display  EKG   Radiology DG Chest 2 View  Result Date: 04/15/2023 CLINICAL DATA:  Cough.  Fever. EXAM: CHEST - 2 VIEW COMPARISON:  10/20/2015 FINDINGS: The heart size and mediastinal contours are within normal limits. Both lungs are clear. The visualized skeletal structures are unremarkable. IMPRESSION: No active cardiopulmonary disease. Electronically Signed   By: Danae Orleans M.D.   On: 04/15/2023  12:53    Procedures Procedures (including critical care time)  Medications Ordered in UC Medications - No data to display  Initial Impression / Assessment and Plan / UC  Course  I have reviewed the triage vital signs and the nursing notes.  Pertinent labs & imaging results that were available during my care of the patient were reviewed by me and considered in my medical decision making (see chart for details).     Reviewed exam and symptoms with patient.  No red flags.  Chest x-ray negative.  Discussed viral upper respiratory illness and symptomatic treatment.  Patient had 3 negative home COVID test and denied COVID testing in clinic Promethazine DM as needed for cough.  Side effect profile reviewed Rest and fluids PCP follow-up if symptoms do not improve ER precautions reviewed and patient verbalized understanding Final Clinical Impressions(s) / UC Diagnoses   Final diagnoses:  Acute cough  Viral upper respiratory illness     Discharge Instructions      Your chest x-ray was negative for pneumonia.  Your symptoms and exam are consistent with a viral upper respiratory illness Promethazine DM as needed for cough.  Please note this medication can make you drowsy.  Do not drink alcohol or drive on this medication You can use over-the-counter decongestants as needed Rest and fluids Follow-up with your PCP if your symptoms do not improve Please go to the ER for any worsening symptoms     ED Prescriptions     Medication Sig Dispense Auth. Provider   promethazine-dextromethorphan (PROMETHAZINE-DM) 6.25-15 MG/5ML syrup Take 5 mLs by mouth 4 (four) times daily as needed for cough. 118 mL Radford Pax, NP      PDMP not reviewed this encounter.   Radford Pax, NP 04/15/23 1301

## 2023-04-15 NOTE — ED Triage Notes (Signed)
Pt presents to UC w/ c/o cough x3 days. Pt states today it became productive. Cough is worse at night. Fever started today. Pt took 3 at home covid tests which were negative.

## 2023-04-17 DIAGNOSIS — F329 Major depressive disorder, single episode, unspecified: Secondary | ICD-10-CM | POA: Diagnosis not present

## 2023-04-20 ENCOUNTER — Ambulatory Visit (INDEPENDENT_AMBULATORY_CARE_PROVIDER_SITE_OTHER): Payer: Self-pay | Admitting: Sports Medicine

## 2023-04-20 DIAGNOSIS — M7662 Achilles tendinitis, left leg: Secondary | ICD-10-CM

## 2023-04-20 NOTE — Progress Notes (Signed)
  HEIDY MCCUBBIN - 57 y.o. female MRN [id number]  Date of birth: [birth year]    CHIEF COMPLAINT:   L achilles    SUBJECTIVE:   HPI:  Pleasant 57 year old female here for follow-up shockwave treatment for her left Achilles.  This is treatment #3 today.  She has had a recent viral illness that has kept her from exercising recently but the heel has been feeling better.  She is still maintaining walking and jogging about 16 miles a week.  We will do treatment #3 today and have her follow-up in about 1 week for treatment #4  Procedure: ECSWT Indications:  L achilles partial thickness tear   Procedure Details Consent: Risks of procedure as well as the alternatives and risks of each were explained to the patient.  Written consent for procedure obtained. Time Out: Verified patient identification, verified procedure, site was marked, verified correct patient position, medications/allergies/relevent history reviewed.  The area was cleaned with alcohol swab.     The L achilles was targeted for Extracorporeal shockwave therapy.    Preset: Achillodynia Power Level: 120 Frequency: 10 Impulse/cycles: 2500 (2000 on achilles and 500 on calf) Head size: large   Patient tolerated procedure well without immediate complications    Arvella Nigh, MD PGY-4, Sports Medicine Fellow [facility]  Shockwave treatment #3 performed as above without complication. Marsa Aris, DO

## 2023-04-27 ENCOUNTER — Ambulatory Visit (INDEPENDENT_AMBULATORY_CARE_PROVIDER_SITE_OTHER): Payer: Self-pay | Admitting: Sports Medicine

## 2023-04-27 VITALS — Ht 66.0 in

## 2023-04-27 DIAGNOSIS — M7662 Achilles tendinitis, left leg: Secondary | ICD-10-CM

## 2023-04-27 NOTE — Progress Notes (Signed)
Patient ID: Whitney Frederick, female   DOB: 1966-10-31, 57 y.o.   MRN: [id number]  Patient presents today for her fourth soundwave treatment of left Achilles tendinopathy.  Treatment performed as below.  She tolerates this without difficulty.  She will return next week for her fifth treatment and the following week she will see Dr. Darrick Penna for her sixth treatment and consideration of repeat ultrasound.  Procedure: ECSWT Indications: Left Achilles partial thickness tear   Procedure Details Consent: Risks of procedure as well as the alternatives and risks of each were explained to the patient.  Written consent for procedure obtained. Time Out: Verified patient identification, verified procedure, site was marked, verified correct patient position, medications/allergies/relevent history reviewed.  The area was cleaned with alcohol swab.     The left Achilles tendon was targeted for Extracorporeal shockwave therapy.    Preset: achillodynia Power Level: 120 Frequency: 10 Impulse/cycles: 2500 Head size: Large   Patient tolerated procedure well without immediate complications   This note was dictated using Dragon naturally speaking software and may contain errors in syntax, spelling, or content which have not been identified prior to signing this note.

## 2023-05-01 DIAGNOSIS — F329 Major depressive disorder, single episode, unspecified: Secondary | ICD-10-CM | POA: Diagnosis not present

## 2023-05-03 ENCOUNTER — Ambulatory Visit (INDEPENDENT_AMBULATORY_CARE_PROVIDER_SITE_OTHER): Payer: Self-pay | Admitting: Sports Medicine

## 2023-05-03 DIAGNOSIS — M7662 Achilles tendinitis, left leg: Secondary | ICD-10-CM

## 2023-05-03 NOTE — Progress Notes (Signed)
Ms. Magnuson is back for fifth session of shockwave therapy to her left Achilles.  She denies any side effects from the procedure or worsening of her pain.  In fact she reports she has had quite a bit of improvement with this modality.  Procedure: ECSWT Indications: Left Achilles partial tendon tear   Procedure Details Consent: Risks of procedure as well as the alternatives and risks of each were explained to the patient.  Written consent for procedure obtained. Time Out: Verified patient identification, verified procedure, site was marked, verified correct patient position, medications/allergies/relevent history reviewed.    The left Achilles tendon was targeted for Extracorporeal shockwave therapy.    Preset: achillodynia  Power Level: 120 Frequency: 12 Impulse/cycles: 2500 Head size: Large   Patient tolerated procedure well without immediate complications.  She will follow-up in 1 week for repeated therapy.   Addendum:  I was the preceptor for this visit and available for immediate consultation.  Norton Blizzard MD Marrianne Mood

## 2023-05-09 ENCOUNTER — Ambulatory Visit: Payer: BC Managed Care – PPO | Admitting: Sports Medicine

## 2023-05-16 ENCOUNTER — Ambulatory Visit: Payer: BC Managed Care – PPO | Admitting: Sports Medicine

## 2023-05-16 ENCOUNTER — Ambulatory Visit (INDEPENDENT_AMBULATORY_CARE_PROVIDER_SITE_OTHER): Payer: Self-pay | Admitting: Sports Medicine

## 2023-05-16 VITALS — Ht 66.0 in | Wt 129.0 lb

## 2023-05-16 VITALS — BP 124/78 | Ht 66.0 in | Wt 129.0 lb

## 2023-05-16 DIAGNOSIS — M7662 Achilles tendinitis, left leg: Secondary | ICD-10-CM

## 2023-05-16 NOTE — Progress Notes (Signed)
Chief complaint: Follow-up of left Achilles tendinitis  Patient had not responded to nitroglycerin and exercise protocols as she did in the past For this reason we have done a series of 6 extracorporal shockwave therapy sessions She states by the last shockwave session she was about 90% of her normal Achilles function She has been running walking 2 to 3 days a week and running 2 to 3 days a week She is getting about 15 to maybe 20 miles per week without worsening her Achilles pain  She does not feel pain when she first gets up in the morning as she did before She does not feel enough pain causes her to limp  Physical exam Pleasant white female who is athletic and in no acute distress BP 124/78   Ht 5\' 6"  (1.676 m)   Wt 129 lb (58.5 kg)   LMP 10/03/2015   BMI 20.82 kg/m   Examination of the left Achilles reveals no nodules Palpation of the left and right Achilles reveals no central differences There is no warmth or redness Palpation of her calves reveals no tightness or pain  Ultrasound of the left Achilles The width of the tendon has returned to almost normal at 0.52 The overall texture of the tendon looks normal There is some minimal fluid in the retrocalcaneal bursa No sign of a tendon tear At the most distal insertion of the tendon there are some calcifications Doppler function reveals very mild neovessel activity.  This is noted at the insertion to the calcaneus on the deep surface of the tendon  Impression: Improved appearance of the left Achilles tendon with mild persistent changes at the insertion to the calcaneus  Ultrasound and interpretation by Royal Hawthorn B. Darrick Penna, MD

## 2023-05-16 NOTE — Assessment & Plan Note (Signed)
ESWT # 6 today Doing well

## 2023-05-16 NOTE — Assessment & Plan Note (Signed)
I reassured her that the appearance of the tendon was improved and the risk of rupture was very low She should continue home exercises and heel lift support indefinitely as she returns to her running regimen She will complete her sixth session of ESWT today  Return can be based on symptoms

## 2023-05-16 NOTE — Progress Notes (Signed)
ESWT # 6  Patient noted significant improvement particularly after her last ESWT session  Head size large Power 120 mJ Impulses 2500 Frequency 16 Area of treatment left Achilles tendon to the insertion and to the lower calf  Patient tolerated the shockwave well and has completed her initial therapy course

## 2023-05-29 DIAGNOSIS — F329 Major depressive disorder, single episode, unspecified: Secondary | ICD-10-CM | POA: Diagnosis not present

## 2023-06-12 DIAGNOSIS — F329 Major depressive disorder, single episode, unspecified: Secondary | ICD-10-CM | POA: Diagnosis not present

## 2023-06-26 DIAGNOSIS — F329 Major depressive disorder, single episode, unspecified: Secondary | ICD-10-CM | POA: Diagnosis not present

## 2023-07-10 DIAGNOSIS — F329 Major depressive disorder, single episode, unspecified: Secondary | ICD-10-CM | POA: Diagnosis not present

## 2023-08-09 ENCOUNTER — Other Ambulatory Visit (HOSPITAL_BASED_OUTPATIENT_CLINIC_OR_DEPARTMENT_OTHER): Payer: Self-pay

## 2023-08-09 MED ORDER — COMIRNATY 30 MCG/0.3ML IM SUSY
PREFILLED_SYRINGE | INTRAMUSCULAR | 0 refills | Status: DC
  Filled 2023-08-09: qty 0.3, 1d supply, fill #0

## 2023-08-21 DIAGNOSIS — F329 Major depressive disorder, single episode, unspecified: Secondary | ICD-10-CM | POA: Diagnosis not present

## 2023-09-21 DIAGNOSIS — F329 Major depressive disorder, single episode, unspecified: Secondary | ICD-10-CM | POA: Diagnosis not present

## 2023-10-18 DIAGNOSIS — F329 Major depressive disorder, single episode, unspecified: Secondary | ICD-10-CM | POA: Diagnosis not present

## 2023-11-19 ENCOUNTER — Ambulatory Visit (INDEPENDENT_AMBULATORY_CARE_PROVIDER_SITE_OTHER): Payer: BC Managed Care – PPO

## 2023-11-19 ENCOUNTER — Ambulatory Visit
Admission: EM | Admit: 2023-11-19 | Discharge: 2023-11-19 | Disposition: A | Discharge status: Home / Self Care | Payer: BC Managed Care – PPO | Attending: Family Medicine | Admitting: Family Medicine

## 2023-11-19 DIAGNOSIS — R053 Chronic cough: Secondary | ICD-10-CM | POA: Diagnosis not present

## 2023-11-19 DIAGNOSIS — J018 Other acute sinusitis: Secondary | ICD-10-CM | POA: Diagnosis not present

## 2023-11-19 DIAGNOSIS — R519 Headache, unspecified: Secondary | ICD-10-CM | POA: Diagnosis not present

## 2023-11-19 DIAGNOSIS — R0981 Nasal congestion: Secondary | ICD-10-CM | POA: Diagnosis not present

## 2023-11-19 DIAGNOSIS — R0989 Other specified symptoms and signs involving the circulatory and respiratory systems: Secondary | ICD-10-CM | POA: Diagnosis not present

## 2023-11-19 MED ORDER — PREDNISONE 20 MG PO TABS: ORAL_TABLET | ORAL | 0 refills | Status: DC

## 2023-11-19 MED ORDER — AMOXICILLIN-POT CLAVULANATE 875-125 MG PO TABS: 1.0000 | ORAL_TABLET | Freq: Two times a day (BID) | ORAL | 0 refills | Status: DC

## 2023-11-19 MED ORDER — PROMETHAZINE-DM 6.25-15 MG/5ML PO SYRP: 5.0000 mL | ORAL_SOLUTION | Freq: Three times a day (TID) | ORAL | 0 refills | Status: DC | PRN

## 2023-11-19 MED ORDER — CETIRIZINE HCL 10 MG PO TABS: 10.0000 mg | ORAL_TABLET | Freq: Every day | ORAL | 0 refills | Status: DC

## 2023-11-19 NOTE — Discharge Instructions (Addendum)
 We will manage this as a sinus infection with Augmentin . For sore throat or cough try using a honey-based tea. Use 3 teaspoons of honey with juice squeezed from half lemon. Place shaved pieces of ginger into 1/2-1 cup of water and warm over stove top. Then mix the ingredients and repeat every 4 hours as needed. Please take Tylenol 500mg -650mg  every 6 hours for throat pain, fevers, aches and pains. Hydrate very well with at least 2 liters of water. Eat light meals such as soups (chicken and noodles, vegetable, chicken and wild rice).  Do not eat foods that you are allergic to.  Taking an antihistamine like Zyrtec  can help against postnasal drainage, sinus congestion which can cause sinus pain, sinus headaches, throat pain, painful swallowing, coughing.  You can take this together with prednisone  for the sinus inflammation and persistent cough.  Use cough medication as needed.

## 2023-11-19 NOTE — ED Provider Notes (Signed)
 [facility]  Note:  This document was prepared using Conservation officer, historic buildings and may include unintentional dictation errors.  MRN: [id number] DOB: 1966/01/07  Subjective:   Whitney Frederick is a 58 y.o. female presenting for 1 month history of persistent sinus congestion, sinus pressure, not having right ear pain.  Has also had chest congestion and a productive cough.  Has not seen anyone for her symptoms.  No smoking of any kind including cigarettes, cigars, vaping, marijuana use.  No asthma.  No current facility-administered medications for this encounter.  Current Outpatient Medications:    COVID-19 mRNA vaccine, Pfizer, (COMIRNATY ) syringe, Inject into the muscle., Disp: 0.3 mL, Rfl: 0   hydrOXYzine  (ATARAX ) 10 MG tablet, Take 1 tablet (10 mg total) by mouth 3 (three) times daily as needed for itching., Disp: 30 tablet, Rfl: 0   mupirocin  ointment (BACTROBAN ) 2 %, Apply 1 application topically 2 (two) times daily., Disp: 15 g, Rfl: 0   nitroGLYCERIN  (NITRODUR - DOSED IN MG/24 HR) 0.2 mg/hr patch, Use 1/4 patch daily to the affected area., Disp: 30 patch, Rfl: 1   promethazine -dextromethorphan (PROMETHAZINE -DM) 6.25-15 MG/5ML syrup, Take 5 mLs by mouth 4 (four) times daily as needed for cough., Disp: 118 mL, Rfl: 0   valACYclovir  (VALTREX ) 1000 MG tablet, TAKE 1 TABS AT SYMPTOM ONSET AND REPEAT IN 12 HOURS., Disp: 180 tablet, Rfl: 1   No Known Allergies  Past Medical History:  Diagnosis Date   Achilles tendon tear    OAB (overactive bladder)    PNA (pneumonia)      History reviewed. No pertinent surgical history.  Family History  Problem Relation Age of Onset   Breast cancer Sister 35       lumpectomy-A&W   Hypertension Mother    Hypertension Father    Heart attack Father 69   Hypertension Brother    Hypertension Sister    Breast cancer Sister 60       bilateral mastectomy- 2017    Hypertension Sister    Hyperlipidemia Brother      Social History   Tobacco Use   Smoking status: Never   Smokeless tobacco: Never  Vaping Use   Vaping status: Never Used  Substance Use Topics   Alcohol use: Yes    Alcohol/week: 10.0 standard drinks of alcohol    Types: 10 Standard drinks or equivalent per week   Drug use: No    ROS   Objective:   Vitals: BP 135/88 (BP Location: Right Arm)   Pulse 61   Temp 98.7 F (37.1 C) (Oral)   Resp 16   LMP 10/03/2015   SpO2 97%   Physical Exam Constitutional:      General: She is not in acute distress.    Appearance: Normal appearance. She is well-developed and normal weight. She is not ill-appearing, toxic-appearing or diaphoretic.  HENT:     Head: Normocephalic and atraumatic.     Right Ear: Ear canal and external ear normal. No drainage or tenderness. No middle ear effusion. There is no impacted cerumen. Tympanic membrane is not erythematous or bulging.     Left Ear: Ear canal and external ear normal. No drainage or tenderness.  No middle ear effusion. There is no impacted cerumen. Tympanic membrane is not erythematous or bulging.     Ears:     Comments: Bilateral middle ear effusion.    Nose: Congestion and rhinorrhea present.     Mouth/Throat:     Mouth:  Mucous membranes are moist. No oral lesions.     Pharynx: No pharyngeal swelling, oropharyngeal exudate, posterior oropharyngeal erythema or uvula swelling.     Tonsils: No tonsillar exudate or tonsillar abscesses.  Eyes:     General: No scleral icterus.       Right eye: No discharge.        Left eye: No discharge.     Extraocular Movements: Extraocular movements intact.     Right eye: Normal extraocular motion.     Left eye: Normal extraocular motion.     Conjunctiva/sclera: Conjunctivae normal.  Cardiovascular:     Rate and Rhythm: Normal rate and regular rhythm.     Heart sounds: Normal heart sounds. No murmur heard.    No friction rub. No gallop.  Pulmonary:     Effort: Pulmonary effort is normal. No  respiratory distress.     Breath sounds: No stridor. No wheezing, rhonchi or rales.  Chest:     Chest wall: No tenderness.  Musculoskeletal:     Cervical back: Normal range of motion and neck supple.  Lymphadenopathy:     Cervical: No cervical adenopathy.  Skin:    General: Skin is warm and dry.  Neurological:     General: No focal deficit present.     Mental Status: She is alert and oriented to person, place, and time.  Psychiatric:        Mood and Affect: Mood normal.        Behavior: Behavior normal.     DG Chest 2 View Result Date: 11/19/2023 CLINICAL DATA:  Runny nose with congestion for 1 month.  Headache. EXAM: CHEST - 2 VIEW COMPARISON:  Radiographs 04/15/2023 FINDINGS: The heart size and mediastinal contours are normal. The lungs are clear. There is no pleural effusion or pneumothorax. No acute osseous findings are identified. IMPRESSION: No evidence of acute cardiopulmonary process. Electronically Signed   By: Elsie Perone M.D.   On: 11/19/2023 09:36     Assessment and Plan :   PDMP not reviewed this encounter.  1. Acute non-recurrent sinusitis of other sinus   2. Persistent cough    Given the extended duration of her symptoms recommend an oral prednisone  course.  In addition will start empiric treatment for sinusitis with Augmentin .  Recommended supportive care otherwise. Counseled patient on potential for adverse effects with medications prescribed/recommended today, ER and return-to-clinic precautions discussed, patient verbalized understanding.    Christopher Savannah, [place] 11/19/23 [id number]

## 2023-11-19 NOTE — ED Triage Notes (Signed)
 Pt presents with c/o runny nose ad congestion x 1 month.    States last night she started feeling exhausted and has a sore throat. Pt states she is c/o possible rt ear infection.   Has a 3/10 headache, feels dull. Has taken Tylenol.

## 2023-12-04 ENCOUNTER — Ambulatory Visit (HOSPITAL_BASED_OUTPATIENT_CLINIC_OR_DEPARTMENT_OTHER): Payer: BC Managed Care – PPO | Admitting: Obstetrics & Gynecology

## 2023-12-20 DIAGNOSIS — F329 Major depressive disorder, single episode, unspecified: Secondary | ICD-10-CM | POA: Diagnosis not present

## 2023-12-25 ENCOUNTER — Ambulatory Visit (HOSPITAL_BASED_OUTPATIENT_CLINIC_OR_DEPARTMENT_OTHER): Payer: BC Managed Care – PPO | Admitting: Obstetrics & Gynecology

## 2023-12-25 ENCOUNTER — Encounter (HOSPITAL_BASED_OUTPATIENT_CLINIC_OR_DEPARTMENT_OTHER): Payer: Self-pay | Admitting: Obstetrics & Gynecology

## 2023-12-25 ENCOUNTER — Other Ambulatory Visit (HOSPITAL_COMMUNITY)
Admission: RE | Admit: 2023-12-25 | Discharge: 2023-12-25 | Disposition: A | Discharge status: Home / Self Care | Payer: BC Managed Care – PPO | Source: Ambulatory Visit | Attending: Obstetrics & Gynecology | Admitting: Obstetrics & Gynecology

## 2023-12-25 VITALS — BP 129/85 | HR 51 | Ht 66.0 in | Wt 133.4 lb

## 2023-12-25 DIAGNOSIS — E7800 Pure hypercholesterolemia, unspecified: Secondary | ICD-10-CM

## 2023-12-25 DIAGNOSIS — Z01419 Encounter for gynecological examination (general) (routine) without abnormal findings: Secondary | ICD-10-CM

## 2023-12-25 DIAGNOSIS — Z124 Encounter for screening for malignant neoplasm of cervix: Secondary | ICD-10-CM | POA: Insufficient documentation

## 2023-12-25 DIAGNOSIS — B001 Herpesviral vesicular dermatitis: Secondary | ICD-10-CM

## 2023-12-25 DIAGNOSIS — Z13820 Encounter for screening for osteoporosis: Secondary | ICD-10-CM

## 2023-12-25 DIAGNOSIS — Z1231 Encounter for screening mammogram for malignant neoplasm of breast: Secondary | ICD-10-CM

## 2023-12-25 MED ORDER — VALACYCLOVIR HCL 1 G PO TABS: ORAL_TABLET | ORAL | 3 refills | Status: AC

## 2023-12-25 NOTE — Progress Notes (Signed)
 58 y.o. G0P0000 Married White or Caucasian female here for annual exam.  Had achilles tendonitis issues last year.  She saw Dr. Nolene Baumgarten and had soundwave therapy.  Does have pain daily but can run.  Denies vaginal bleeding.    Patient's last menstrual period was 10/03/2015.          The current method of family planning is post menopausal status.    Exercising: running, walking, lifting weights Smoker:  no  Health Maintenance: Pap:  08/04/2021 History of abnormal Pap:  remote hx in her early 20's MMG:  09/15/2022.  She is aware this is due.   Colonoscopy:  02/03/2022, follow up 5 years BMD:   order placed Screening Labs: ordered for pt today   reports that she has never smoked. She has never used smokeless tobacco. She reports current alcohol use of about 10.0 standard drinks of alcohol per week. She reports that she does not use drugs.  Past Medical History:  Diagnosis Date   Achilles tendon tear    OAB (overactive bladder)    PNA (pneumonia)     History reviewed. No pertinent surgical history.  Current Outpatient Medications  Medication Sig Dispense Refill   mupirocin  ointment (BACTROBAN ) 2 % Apply 1 application topically 2 (two) times daily. 15 g 0   nitroGLYCERIN  (NITRODUR - DOSED IN MG/24 HR) 0.2 mg/hr patch Use 1/4 patch daily to the affected area. 30 patch 1   valACYclovir  (VALTREX ) 1000 MG tablet TAKE 1 TABS AT SYMPTOM ONSET AND REPEAT IN 12 HOURS. 180 tablet 1   hydrOXYzine  (ATARAX ) 10 MG tablet Take 1 tablet (10 mg total) by mouth 3 (three) times daily as needed for itching. (Patient not taking: Reported on 12/25/2023) 30 tablet 0   No current facility-administered medications for this visit.    Family History  Problem Relation Age of Onset   Breast cancer Sister 45       lumpectomy-A&W   Hypertension Mother    Hypertension Father    Heart attack Father 93   Hypertension Brother    Hypertension Sister    Breast cancer Sister 39       bilateral mastectomy- 2017     Hypertension Sister    Hyperlipidemia Brother     ROS: Constitutional: negative Genitourinary:negative  Exam:   BP 129/85 (BP Location: Right Arm, Patient Position: Sitting, Cuff Size: Normal)   Pulse (!) 51   Ht 5\' 6"  (1.676 m)   Wt 133 lb 6.4 oz (60.5 kg)   LMP 10/03/2015   BMI 21.53 kg/m   Height: 5\' 6"  (167.6 cm)  General appearance: alert, cooperative and appears stated age Head: Normocephalic, without obvious abnormality, atraumatic Neck: no adenopathy, supple, symmetrical, trachea midline and thyroid  normal to inspection and palpation Lungs: clear to auscultation bilaterally Breasts: normal appearance, no masses or tenderness Heart: regular rate and rhythm Abdomen: soft, non-tender; bowel sounds normal; no masses,  no organomegaly Extremities: extremities normal, atraumatic, no cyanosis or edema Skin: Skin color, texture, turgor normal. No rashes or lesions Lymph nodes: Cervical, supraclavicular, and axillary nodes normal. No abnormal inguinal nodes palpated Neurologic: Grossly normal   Pelvic: External genitalia:  no lesions              Urethra:  normal appearing urethra with no masses, tenderness or lesions              Bartholins and Skenes: normal  Vagina: normal appearing vagina with normal color and no discharge, no lesions              Cervix: no lesions              Pap taken: Yes.   Bimanual Exam:  Uterus:  normal size, contour, position, consistency, mobility, non-tender              Adnexa: normal adnexa and no mass, fullness, tenderness               Rectovaginal: Confirms               Anus:  normal sphincter tone, no lesions  Chaperone, Arnetta Bianchi, CMA, was present for exam.  Assessment/Plan: 1. Well woman exam with routine gynecological exam (Primary) - Pap smear obtained today - Mammogram ordered - Colonoscopy 2023 - Bone mineral density ordered - lab work ordered - vaccines reviewed/updated  2. Cervical cancer  screening - Cytology - PAP( [place])  3. Encounter for screening mammogram for malignant neoplasm of breast - MM 3D SCREENING MAMMOGRAM BILATERAL BREAST; Future  4. Osteoporosis screening - DG BONE DENSITY (DXA); Future  5. Elevated cholesterol - Comprehensive metabolic panel - Hemoglobin A1c - Lipid panel  6. Fever blister - valACYclovir  (VALTREX ) 1000 MG tablet; TAKE 1 TABS AT SYMPTOM ONSET AND REPEAT IN 12 HOURS.  Dispense: 30 tablet; Refill: 3

## 2023-12-26 ENCOUNTER — Encounter (HOSPITAL_BASED_OUTPATIENT_CLINIC_OR_DEPARTMENT_OTHER): Payer: Self-pay | Admitting: Obstetrics & Gynecology

## 2023-12-26 LAB — COMPREHENSIVE METABOLIC PANEL
ALT: 16 [IU]/L (ref 0–32)
AST: 25 [IU]/L (ref 0–40)
Albumin: 4.5 g/dL (ref 3.8–4.9)
Alkaline Phosphatase: 94 [IU]/L (ref 44–121)
BUN/Creatinine Ratio: 20 (ref 12–28)
BUN: 12 mg/dL (ref 8–27)
Bilirubin Total: 0.7 mg/dL (ref 0.0–1.2)
CO2: 20 mmol/L (ref 20–29)
Calcium: 9.3 mg/dL (ref 8.7–10.3)
Chloride: 103 mmol/L (ref 96–106)
Creatinine, Ser: 0.61 mg/dL (ref 0.57–1.00)
Globulin, Total: 2.5 g/dL (ref 1.5–4.5)
Glucose: 81 mg/dL (ref 70–99)
Potassium: 4.1 mmol/L (ref 3.5–5.2)
Sodium: 140 mmol/L (ref 134–144)
Total Protein: 7 g/dL (ref 6.0–8.5)
eGFR: 102 mL/min/{1.73_m2} (ref >59)

## 2023-12-26 LAB — LIPID PANEL
Chol/HDL Ratio: 2 {ratio} (ref 0.0–4.4)
Cholesterol, Total: 221 mg/dL — ABNORMAL HIGH (ref 100–199)
HDL: 112 mg/dL (ref >39)
LDL Chol Calc (NIH): 100 mg/dL — ABNORMAL HIGH (ref 0–99)
Triglycerides: 51 mg/dL (ref 0–149)
VLDL Cholesterol Cal: 9 mg/dL (ref 5–40)

## 2023-12-26 LAB — HEMOGLOBIN A1C
Est. average glucose Bld gHb Est-mCnc: 114 mg/dL
Hgb A1c MFr Bld: 5.6 % (ref 4.8–5.6)

## 2023-12-28 LAB — CYTOLOGY - PAP
Comment: NEGATIVE
Diagnosis: NEGATIVE
High risk HPV: NEGATIVE

## 2024-01-11 ENCOUNTER — Ambulatory Visit
Admission: RE | Admit: 2024-01-11 | Discharge: 2024-01-11 | Disposition: A | Discharge status: Home / Self Care | Payer: BC Managed Care – PPO | Source: Ambulatory Visit | Attending: Obstetrics & Gynecology | Admitting: Obstetrics & Gynecology

## 2024-01-11 DIAGNOSIS — Z1231 Encounter for screening mammogram for malignant neoplasm of breast: Secondary | ICD-10-CM | POA: Diagnosis not present

## 2024-06-20 ENCOUNTER — Ambulatory Visit (HOSPITAL_BASED_OUTPATIENT_CLINIC_OR_DEPARTMENT_OTHER)
Admission: RE | Admit: 2024-06-20 | Discharge: 2024-06-20 | Disposition: A | Discharge status: Home / Self Care | Source: Ambulatory Visit | Attending: Obstetrics & Gynecology | Admitting: Obstetrics & Gynecology

## 2024-06-20 DIAGNOSIS — M8589 Other specified disorders of bone density and structure, multiple sites: Secondary | ICD-10-CM | POA: Diagnosis not present

## 2024-06-20 DIAGNOSIS — Z13820 Encounter for screening for osteoporosis: Secondary | ICD-10-CM | POA: Insufficient documentation

## 2024-06-20 DIAGNOSIS — Z780 Asymptomatic menopausal state: Secondary | ICD-10-CM | POA: Diagnosis not present

## 2024-06-21 ENCOUNTER — Ambulatory Visit (HOSPITAL_BASED_OUTPATIENT_CLINIC_OR_DEPARTMENT_OTHER): Payer: Self-pay | Admitting: Obstetrics & Gynecology

## 2024-08-15 ENCOUNTER — Ambulatory Visit

## 2024-08-16 ENCOUNTER — Other Ambulatory Visit: Payer: BC Managed Care – PPO

## 2024-09-18 ENCOUNTER — Other Ambulatory Visit (HOSPITAL_BASED_OUTPATIENT_CLINIC_OR_DEPARTMENT_OTHER): Payer: Self-pay

## 2024-09-18 MED ORDER — FLUZONE 0.5 ML IM SUSY
0.5000 mL | PREFILLED_SYRINGE | Freq: Once | INTRAMUSCULAR | 0 refills | Status: AC
  Filled 2024-09-18: qty 0.5, 1d supply, fill #0

## 2024-09-18 MED ORDER — COMIRNATY 30 MCG/0.3ML IM SUSY
0.3000 mL | PREFILLED_SYRINGE | Freq: Once | INTRAMUSCULAR | 0 refills | Status: AC
  Filled 2024-09-18: qty 0.3, 1d supply, fill #0

## 2025-01-06 ENCOUNTER — Ambulatory Visit (HOSPITAL_BASED_OUTPATIENT_CLINIC_OR_DEPARTMENT_OTHER): Payer: BC Managed Care – PPO | Admitting: Obstetrics & Gynecology
